# Patient Record
Sex: Female | Born: 1980 | Race: White | Hispanic: No | Marital: Married | Smoking: Former smoker
Health system: Southern US, Community
[De-identification: ages and names within clinical notes are randomized; demographics above are authoritative.]

## PROBLEM LIST (undated history)

## (undated) ENCOUNTER — Inpatient Hospital Stay (HOSPITAL_COMMUNITY): Payer: Self-pay

## (undated) DIAGNOSIS — E669 Obesity, unspecified: Secondary | ICD-10-CM

## (undated) DIAGNOSIS — B009 Herpesviral infection, unspecified: Secondary | ICD-10-CM

## (undated) DIAGNOSIS — O139 Gestational [pregnancy-induced] hypertension without significant proteinuria, unspecified trimester: Secondary | ICD-10-CM

## (undated) HISTORY — DX: Herpesviral infection, unspecified: B00.9

## (undated) HISTORY — PX: TONSILLECTOMY: SUR1361

## (undated) HISTORY — DX: Obesity, unspecified: E66.9

## (undated) HISTORY — DX: Gestational (pregnancy-induced) hypertension without significant proteinuria, unspecified trimester: O13.9

---
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
## 2000-06-13 ENCOUNTER — Other Ambulatory Visit: Admission: RE | Admit: 2000-06-13 | Discharge: 2000-06-13 | Payer: Self-pay | Admitting: Gynecology

## 2001-10-18 ENCOUNTER — Emergency Department (HOSPITAL_COMMUNITY): Admission: EM | Admit: 2001-10-18 | Discharge: 2001-10-19 | Payer: Self-pay | Admitting: Emergency Medicine

## 2001-12-16 ENCOUNTER — Other Ambulatory Visit: Admission: RE | Admit: 2001-12-16 | Discharge: 2001-12-16 | Payer: Self-pay | Admitting: Obstetrics and Gynecology

## 2001-12-16 ENCOUNTER — Other Ambulatory Visit: Admission: RE | Admit: 2001-12-16 | Discharge: 2001-12-16 | Payer: Self-pay

## 2002-04-08 ENCOUNTER — Emergency Department (HOSPITAL_COMMUNITY): Admission: EM | Admit: 2002-04-08 | Discharge: 2002-04-08 | Payer: Self-pay | Admitting: Emergency Medicine

## 2002-04-08 ENCOUNTER — Encounter: Payer: Self-pay | Admitting: Emergency Medicine

## 2002-04-14 ENCOUNTER — Emergency Department (HOSPITAL_COMMUNITY): Admission: EM | Admit: 2002-04-14 | Discharge: 2002-04-14 | Payer: Self-pay | Admitting: Emergency Medicine

## 2002-10-30 ENCOUNTER — Emergency Department (HOSPITAL_COMMUNITY): Admission: EM | Admit: 2002-10-30 | Discharge: 2002-10-30 | Payer: Self-pay | Admitting: Emergency Medicine

## 2002-12-01 ENCOUNTER — Emergency Department (HOSPITAL_COMMUNITY): Admission: EM | Admit: 2002-12-01 | Discharge: 2002-12-01 | Payer: Self-pay | Admitting: Emergency Medicine

## 2002-12-01 ENCOUNTER — Encounter: Payer: Self-pay | Admitting: Emergency Medicine

## 2003-03-17 ENCOUNTER — Emergency Department (HOSPITAL_COMMUNITY): Admission: EM | Admit: 2003-03-17 | Discharge: 2003-03-17 | Payer: Self-pay | Admitting: Emergency Medicine

## 2003-03-22 ENCOUNTER — Emergency Department (HOSPITAL_COMMUNITY): Admission: EM | Admit: 2003-03-22 | Discharge: 2003-03-22 | Payer: Self-pay | Admitting: Emergency Medicine

## 2005-01-09 ENCOUNTER — Other Ambulatory Visit: Admission: RE | Admit: 2005-01-09 | Discharge: 2005-01-09 | Payer: Self-pay | Admitting: Gynecology

## 2005-08-01 ENCOUNTER — Emergency Department (HOSPITAL_COMMUNITY): Admission: EM | Admit: 2005-08-01 | Discharge: 2005-08-01 | Payer: Self-pay | Admitting: Emergency Medicine

## 2006-02-05 ENCOUNTER — Other Ambulatory Visit: Admission: RE | Admit: 2006-02-05 | Discharge: 2006-02-05 | Payer: Self-pay | Admitting: Gynecology

## 2006-04-11 IMAGING — CR DG CHEST 2V
2 series · 2 of 2 positions shown · non-contrast
Comparison: None.

CLINICAL DATA: Asthma attack. 
 CHEST - 2 VIEW:

[view not recorded (1 of 2)]
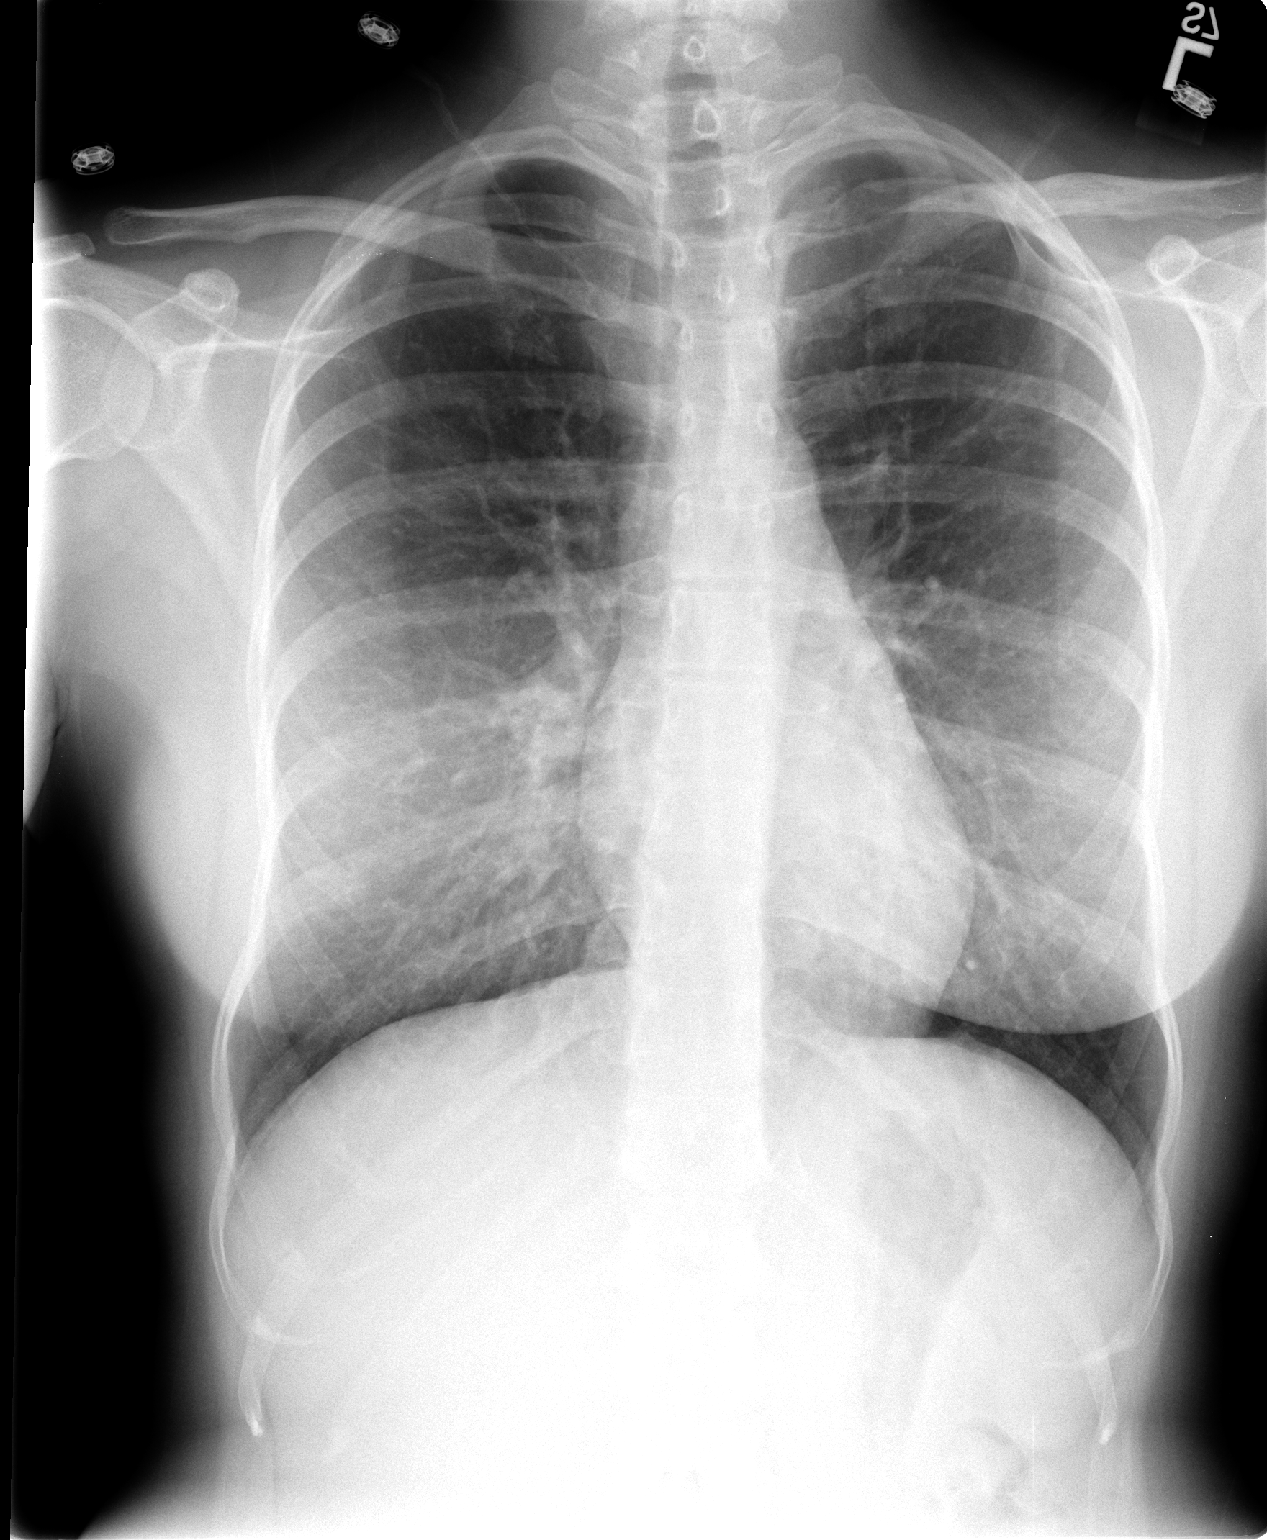

[view not recorded (2 of 2)]
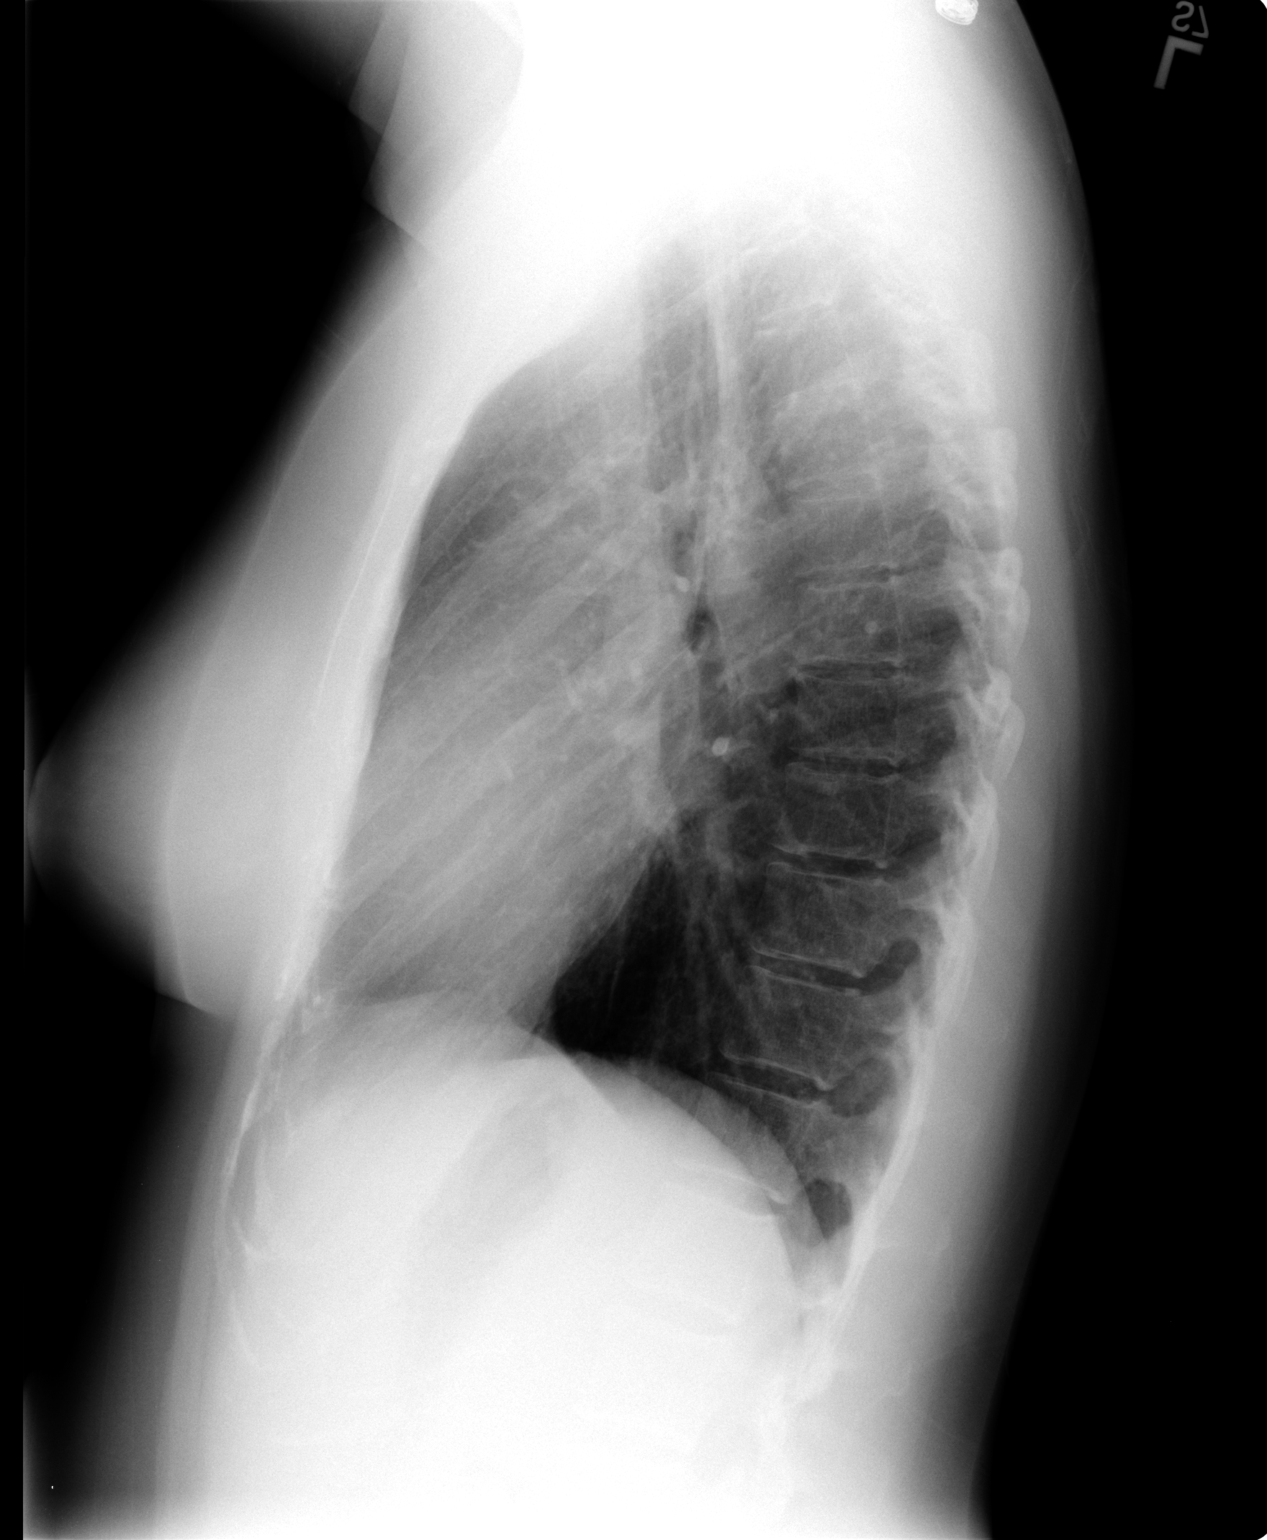

[2 of 2 positions shown; findings below may reference images not displayed]

FINDINGS: The heart size is normal. There are no effusions. 
 The lungs are hyperinflated. There is mild peribronchial thickening.  No infiltrate is noted.
IMPRESSION: Hyperinflated lungs with peribronchial thickening consistent with reactive airways disease.

## 2006-11-30 ENCOUNTER — Emergency Department (HOSPITAL_COMMUNITY): Admission: EM | Admit: 2006-11-30 | Discharge: 2006-11-30 | Payer: Self-pay | Admitting: Emergency Medicine

## 2007-02-04 ENCOUNTER — Inpatient Hospital Stay (HOSPITAL_COMMUNITY): Admission: AD | Admit: 2007-02-04 | Discharge: 2007-02-04 | Payer: Self-pay | Admitting: Obstetrics and Gynecology

## 2007-03-28 ENCOUNTER — Inpatient Hospital Stay (HOSPITAL_COMMUNITY): Admission: AD | Admit: 2007-03-28 | Discharge: 2007-03-28 | Payer: Self-pay | Admitting: Obstetrics and Gynecology

## 2007-06-25 ENCOUNTER — Inpatient Hospital Stay (HOSPITAL_COMMUNITY): Admission: AD | Admit: 2007-06-25 | Discharge: 2007-06-27 | Payer: Self-pay | Admitting: Obstetrics and Gynecology

## 2007-08-10 IMAGING — US US OB COMP LESS 14 WK
1 series · 14 of 28 positions shown · non-contrast
Comparison: None.

CLINICAL DATA: 25-year-old, 10 weeks pregnant with bleeding and spotting.
 OBSTETRICAL ULTRASOUND <14 WKS AND TRANSVAGINAL OB US:
TECHNIQUE: Both transabdominal and transvaginal ultrasound examinations were performed for complete evaluation of the gestation as well as the maternal uterus, adnexal regions, and pelvic cul-de-sac.

[Series 1: unknown · 0.32mm/px · 14 of 69 slices shown]
[im 3/69]
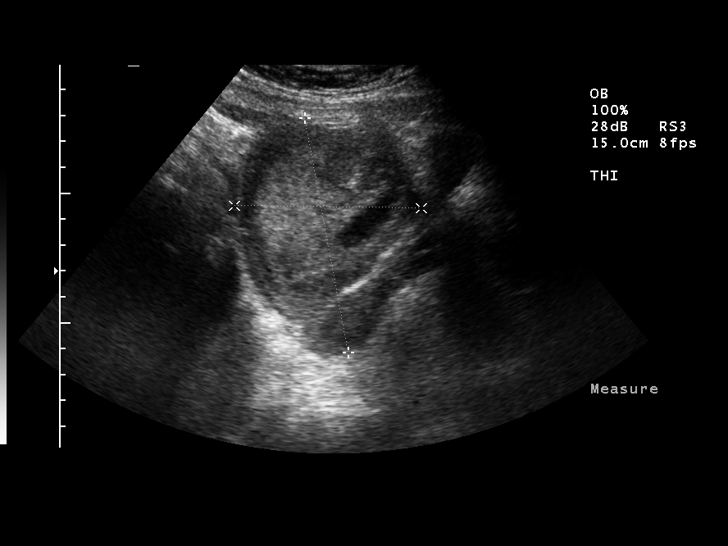
[im 8/69]
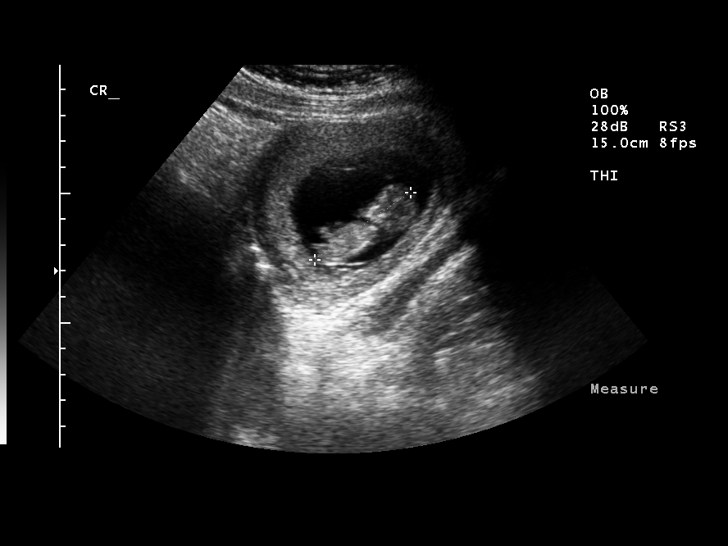
[im 13/69]
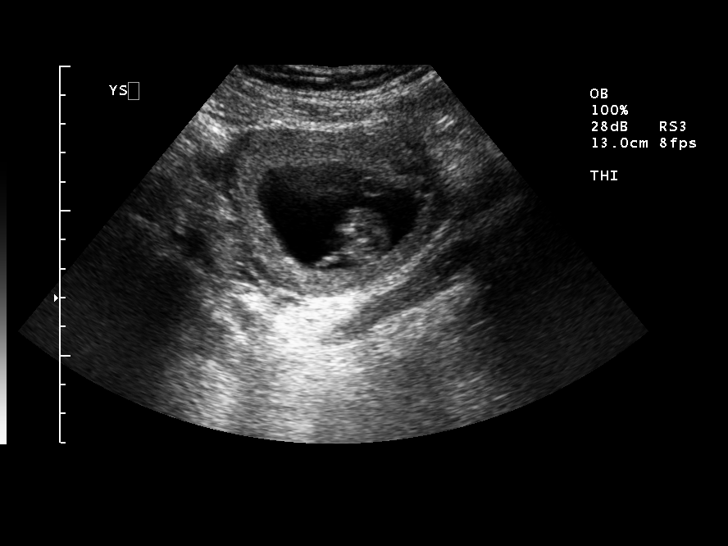
[im 18/69]
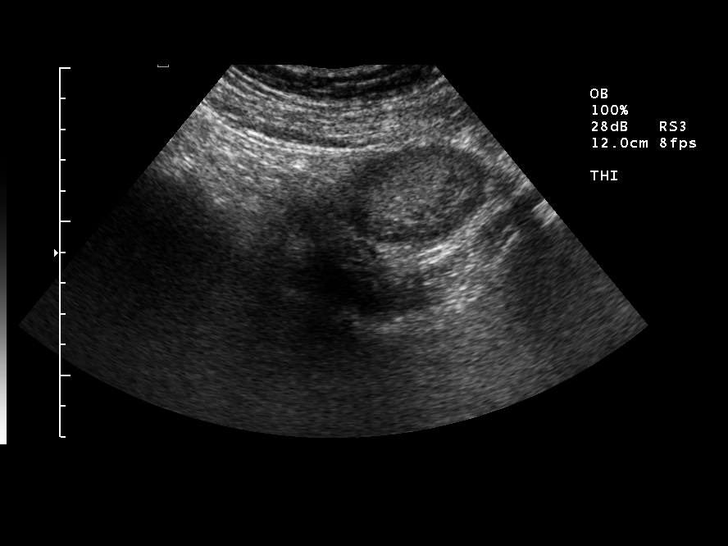
[im 23/69]
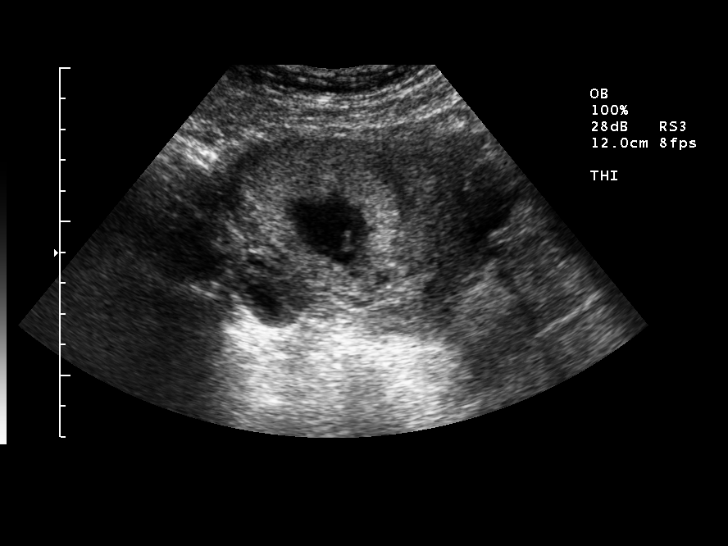
[im 28/69]
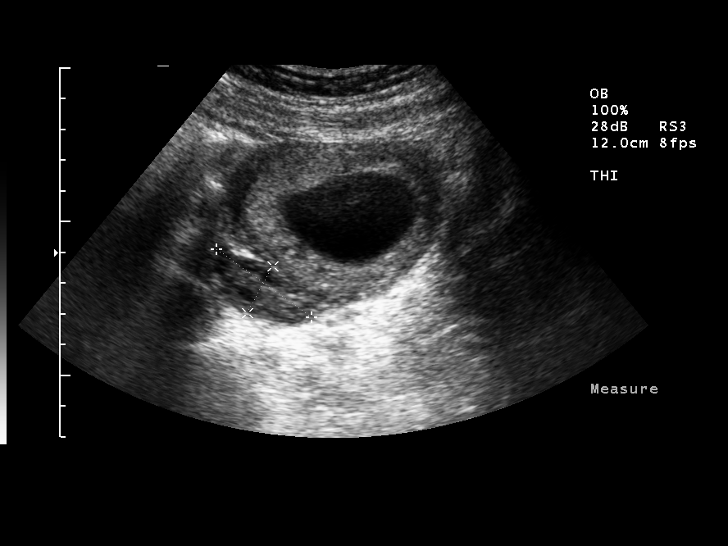
[im 33/69]
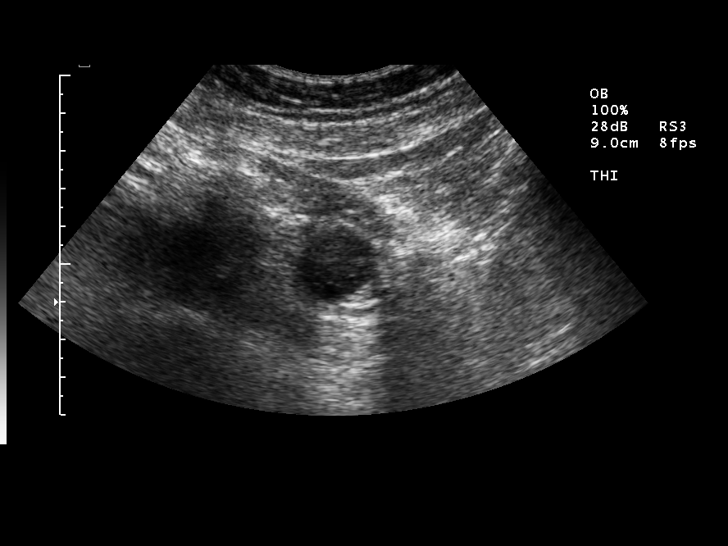
[im 38/69]
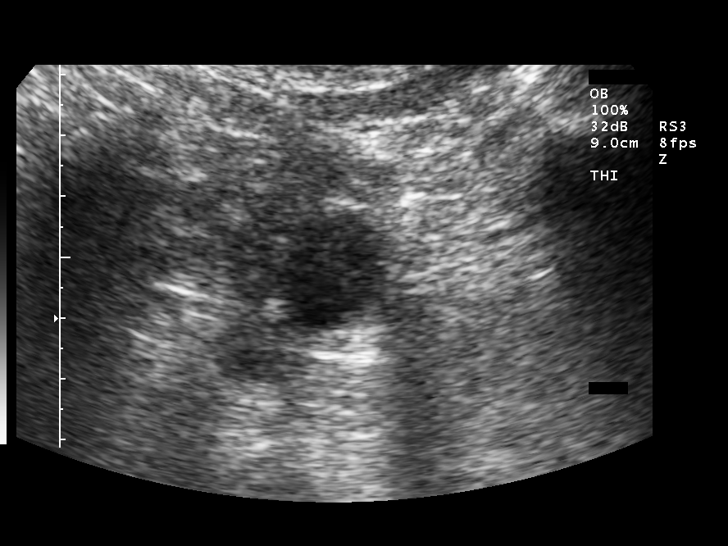
[im 43/69]
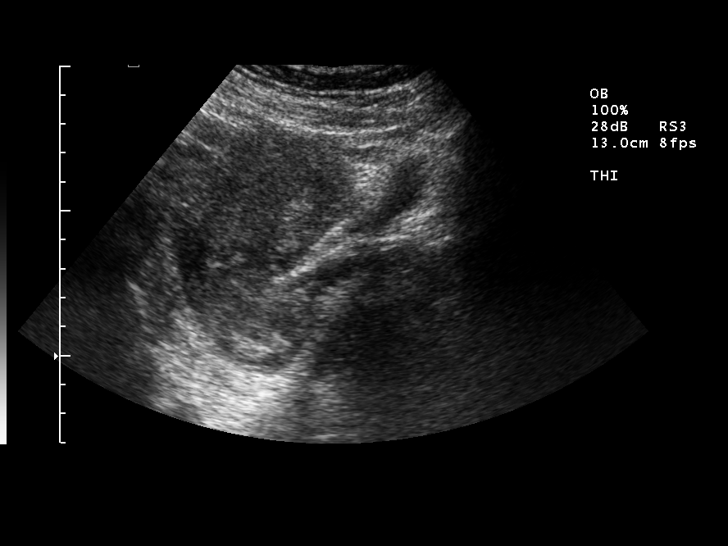
[im 48/69]
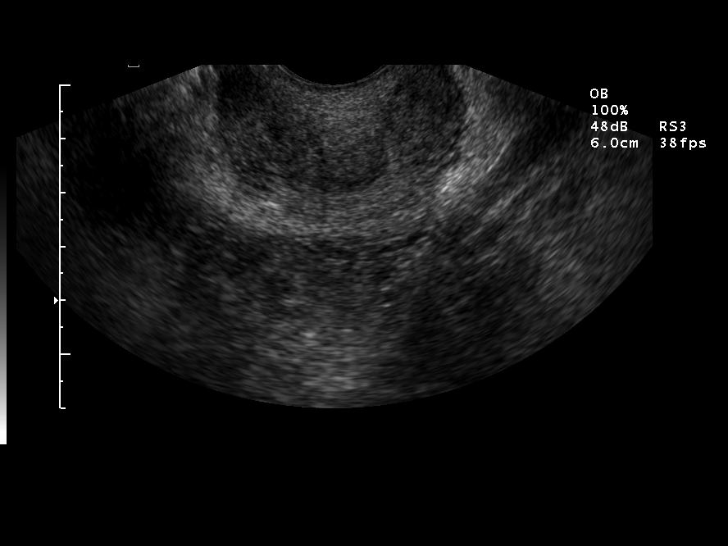
[im 53/69]
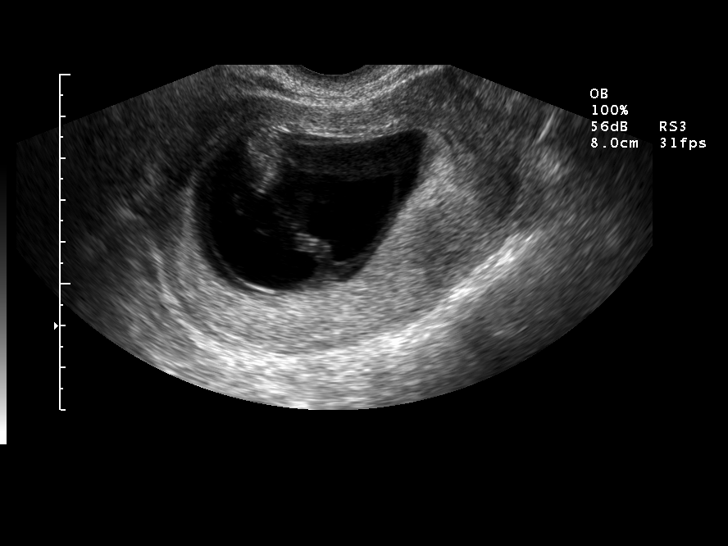
[im 58/69]
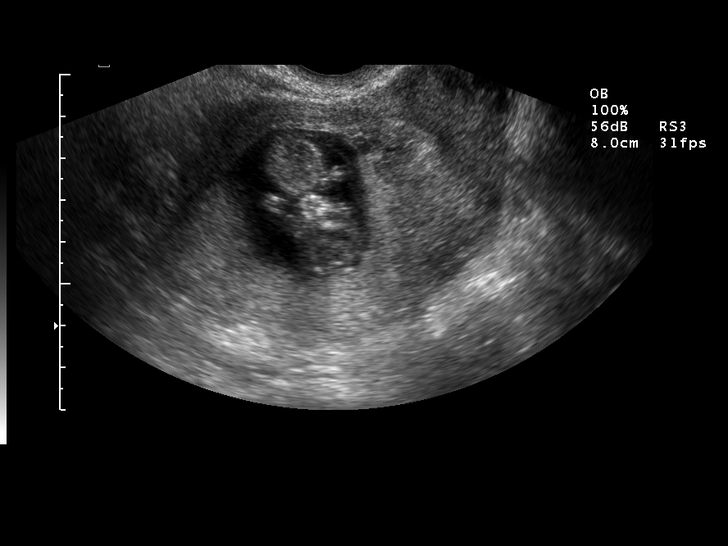
[im 63/69]
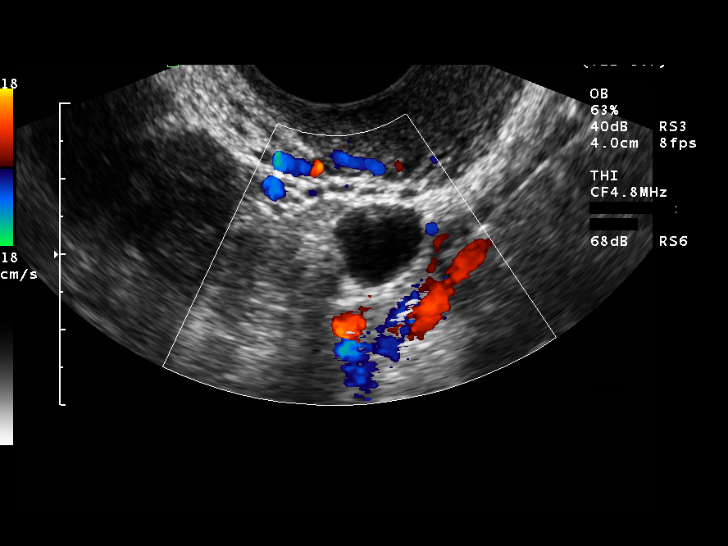
[im 69/69]
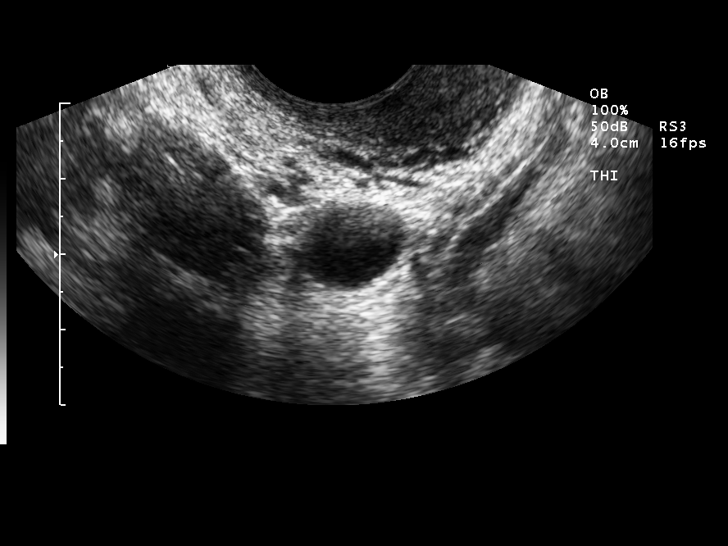

[14 of 28 positions shown; findings below may reference images not displayed]

FINDINGS: There is a single living intrauterine fetus estimated at 11 weeks and 0 days gestation based on crown-rump length of 43.8 mm.  Cardiac activity is noted with a heart rate of 163 beats per minute.  A small yolk sac is seen.
 No subchorionic hemorrhage is seen.  Both ovaries are visualized and appear normal.  Probable small bilateral ovarian cysts.  Trace free pelvic fluid.
IMPRESSION: Single living intrauterine fetus estimated at 11 weeks 0 days gestation by ultrasound.

## 2008-06-24 ENCOUNTER — Inpatient Hospital Stay (HOSPITAL_COMMUNITY): Admission: AD | Admit: 2008-06-24 | Discharge: 2008-06-26 | Payer: Self-pay | Admitting: Obstetrics and Gynecology

## 2008-07-06 ENCOUNTER — Inpatient Hospital Stay (HOSPITAL_COMMUNITY): Admission: AD | Admit: 2008-07-06 | Discharge: 2008-07-06 | Payer: Self-pay | Admitting: Obstetrics and Gynecology

## 2009-04-23 ENCOUNTER — Emergency Department (HOSPITAL_COMMUNITY): Admission: EM | Admit: 2009-04-23 | Discharge: 2009-04-23 | Payer: Self-pay | Admitting: Emergency Medicine

## 2010-01-20 ENCOUNTER — Inpatient Hospital Stay (HOSPITAL_COMMUNITY): Admission: AD | Admit: 2010-01-20 | Discharge: 2010-01-20 | Payer: Self-pay | Admitting: Obstetrics and Gynecology

## 2010-01-21 ENCOUNTER — Inpatient Hospital Stay (HOSPITAL_COMMUNITY): Admission: AD | Admit: 2010-01-21 | Discharge: 2010-01-21 | Payer: Self-pay | Admitting: Obstetrics and Gynecology

## 2010-09-30 ENCOUNTER — Inpatient Hospital Stay (HOSPITAL_COMMUNITY)
Admission: AD | Admit: 2010-09-30 | Discharge: 2010-09-30 | Payer: Self-pay | Source: Home / Self Care | Attending: Obstetrics and Gynecology | Admitting: Obstetrics and Gynecology

## 2010-11-09 ENCOUNTER — Inpatient Hospital Stay (HOSPITAL_COMMUNITY)
Admission: RE | Admit: 2010-11-09 | Discharge: 2010-11-10 | DRG: 774 | Disposition: A | Discharge status: Home / Self Care | Payer: Medicaid Other | Source: Ambulatory Visit | Attending: Obstetrics and Gynecology | Admitting: Obstetrics and Gynecology

## 2010-11-09 DIAGNOSIS — A6000 Herpesviral infection of urogenital system, unspecified: Secondary | ICD-10-CM | POA: Diagnosis present

## 2010-11-09 DIAGNOSIS — O480 Post-term pregnancy: Principal | ICD-10-CM | POA: Diagnosis present

## 2010-11-09 DIAGNOSIS — O98519 Other viral diseases complicating pregnancy, unspecified trimester: Secondary | ICD-10-CM | POA: Diagnosis present

## 2010-11-09 LAB — CBC
HCT: 31 % — ABNORMAL LOW (ref 36.0–46.0)
Hemoglobin: 9.9 g/dL — ABNORMAL LOW (ref 12.0–15.0)
MCH: 26.8 pg (ref 26.0–34.0)
MCHC: 31.9 g/dL (ref 30.0–36.0)
MCV: 84 fL (ref 78.0–100.0)
Platelets: 204 10*3/uL (ref 150–400)
RBC: 3.69 MIL/uL — ABNORMAL LOW (ref 3.87–5.11)
RDW: 14.7 % (ref 11.5–15.5)
WBC: 10 10*3/uL (ref 4.0–10.5)

## 2010-11-09 LAB — RPR: RPR Ser Ql: NONREACTIVE

## 2010-11-10 LAB — CBC
HCT: 28.1 % — ABNORMAL LOW (ref 36.0–46.0)
Hemoglobin: 8.9 g/dL — ABNORMAL LOW (ref 12.0–15.0)
MCH: 27.4 pg (ref 26.0–34.0)
MCHC: 31.7 g/dL (ref 30.0–36.0)
MCV: 86.5 fL (ref 78.0–100.0)
Platelets: 187 10*3/uL (ref 150–400)
RBC: 3.25 MIL/uL — ABNORMAL LOW (ref 3.87–5.11)
RDW: 14.9 % (ref 11.5–15.5)
WBC: 9.1 10*3/uL (ref 4.0–10.5)

## 2010-11-22 NOTE — Discharge Summary (Signed)
  NAMEMARGET, OUTTEN              ACCOUNT NO.:  [id number]  MEDICAL RECORD NO.:  [id number]           PATIENT TYPE:  I  LOCATION:  9113                          FACILITY:  [facility]  PHYSICIAN:  Huel Cote, M.D. DATE OF BIRTH:  [birth year]  DATE OF ADMISSION:  11/09/2010 DATE OF DISCHARGE:  11/10/2010                              DISCHARGE SUMMARY   DISCHARGE DIAGNOSES: 1. Term pregnancy at 40-3/7 weeks delivered. 2. Status post normal spontaneous vaginal delivery.  DISCHARGE MEDICATIONS:  Motrin 6 mg p.o. every 6 hours.  DISCHARGE FOLLOWUP:  The patient is to follow up in the office in 6 weeks for her full postpartum exam.  HOSPITAL COURSE:  The patient is a 30 year old G4, P 2-0-1-2 who came in at 40-3/7 weeks for induction of labor given post due date and favorable cervix.  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  Rh negative and received RhoGAM.  No other issues.  LABORATORY DATA:  Prenatal labs were as follows:  A negative, antibody negative, rubella equivocal, RPR nonreactive, hepatitis B surface antigen negative, HIV negative, GC negative, Chlamydia negative, cystic fibrosis negative, first trimester screen normal.  Alpha-fetoprotein normal, 1-hour Glucola 114, group B strep negative.  PAST OBSTETRICAL HISTORY:  She has had 2 prior vaginal deliveries, one in 2008 of a 7 pounds 14 ounces infant.  The other in 2009 of a 6 pounds 12 ounces infant and one spontaneous miscarriage.  PAST GYN HISTORY:  History of HSV.  PAST SURGICAL HISTORY:  Tonsillectomy in 1996.  PAST MEDICAL HISTORY:  Asthma, stable with her inhaler. 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  ALLERGIES:  PENICILLIN.  MEDICATIONS:  Medications were Valtrex and prenatal vitamins.  PHYSICAL EXAMINATION:  VITAL SIGNS:  On admission, she was afebrile with stable vital  signs.  Fetal heart rate is reactive.  Cervix was 50, 3-4, and -2 station.  She had rupture of membranes performed after low-dose Pitocin was started.  She progressed quickly throughout the day reached complete dilation and had a normal spontaneous vaginal delivery of a vigorous female infant over an intact perineum.  Apgars were 8 and 9, weight was 7 pounds 2 ounces.  Placenta delivery was spontaneous. Estimated blood loss was 400 mL.  She had no lacerations.  On postpartum day #2, she was doing quite well.  Hemoglobin was 8.9 down from 9.9, and she requested early discharge and this is clear with pediatrician, she will be leaving this evening and requested circumcision for infant which will be done this morning.     Huel Cote, M.D.     KR/MEDQ  D:  11/10/2010  T:  11/11/2010  Job:  [id number]  Electronically Signed by Huel Cote M.D. on 11/16/2010 09:10:37 AM

## 2010-12-27 LAB — HCG, QUANTITATIVE, PREGNANCY: hCG, Beta Chain, Quant, S: 756 m[IU]/mL — ABNORMAL HIGH (ref <5)

## 2010-12-28 LAB — URINALYSIS, ROUTINE W REFLEX MICROSCOPIC
Bilirubin Urine: NEGATIVE
Glucose, UA: NEGATIVE mg/dL
Ketones, ur: NEGATIVE mg/dL
Leukocytes, UA: NEGATIVE
Nitrite: NEGATIVE
Protein, ur: NEGATIVE mg/dL
Specific Gravity, Urine: 1.01 (ref 1.005–1.030)
Urobilinogen, UA: 0.2 mg/dL (ref 0.0–1.0)
pH: 7.5 (ref 5.0–8.0)

## 2010-12-28 LAB — GC/CHLAMYDIA PROBE AMP, GENITAL
Chlamydia, DNA Probe: NEGATIVE
GC Probe Amp, Genital: NEGATIVE

## 2010-12-28 LAB — RH IMMUNE GLOBULIN WORKUP (NOT WOMEN'S HOSP)
ABO/RH(D): A NEG
Antibody Screen: NEGATIVE

## 2010-12-28 LAB — CBC
HCT: 38 % (ref 36.0–46.0)
Hemoglobin: 13 g/dL (ref 12.0–15.0)
MCHC: 34.1 g/dL (ref 30.0–36.0)
MCV: 93.4 fL (ref 78.0–100.0)
Platelets: 208 10*3/uL (ref 150–400)
RBC: 4.07 MIL/uL (ref 3.87–5.11)
RDW: 13.3 % (ref 11.5–15.5)
WBC: 5.9 10*3/uL (ref 4.0–10.5)

## 2010-12-28 LAB — WET PREP, GENITAL
Trich, Wet Prep: NONE SEEN
Yeast Wet Prep HPF POC: NONE SEEN

## 2010-12-28 LAB — ABO/RH: ABO/RH(D): A NEG

## 2010-12-28 LAB — POCT PREGNANCY, URINE: Preg Test, Ur: POSITIVE

## 2010-12-28 LAB — URINE MICROSCOPIC-ADD ON

## 2010-12-28 LAB — HCG, QUANTITATIVE, PREGNANCY: hCG, Beta Chain, Quant, S: 1347 m[IU]/mL — ABNORMAL HIGH (ref <5)

## 2011-01-15 LAB — URINALYSIS, ROUTINE W REFLEX MICROSCOPIC
Bilirubin Urine: NEGATIVE
Glucose, UA: NEGATIVE mg/dL
Hgb urine dipstick: NEGATIVE
Ketones, ur: NEGATIVE mg/dL
Nitrite: NEGATIVE
Protein, ur: NEGATIVE mg/dL
Specific Gravity, Urine: 1.014 (ref 1.005–1.030)
Urobilinogen, UA: 1 mg/dL (ref 0.0–1.0)
pH: 7 (ref 5.0–8.0)

## 2011-01-15 LAB — GC/CHLAMYDIA PROBE AMP, GENITAL
Chlamydia, DNA Probe: NEGATIVE
GC Probe Amp, Genital: NEGATIVE

## 2011-01-15 LAB — WET PREP, GENITAL
Clue Cells Wet Prep HPF POC: NONE SEEN
Trich, Wet Prep: NONE SEEN
Yeast Wet Prep HPF POC: NONE SEEN

## 2011-01-15 LAB — URINE MICROSCOPIC-ADD ON

## 2011-01-15 LAB — PREGNANCY, URINE: Preg Test, Ur: NEGATIVE

## 2011-02-24 NOTE — Discharge Summary (Signed)
NAMEEVANEE, LUBRANO NO.:  [id number]   MEDICAL RECORD NO.:  [id number]          PATIENT TYPE:  INP   LOCATION:  9133                          FACILITY:  [facility]   PHYSICIAN:  Huel Cote, M.D. DATE OF BIRTH:  11-09-1980   DATE OF ADMISSION:  06/25/2007  DATE OF DISCHARGE:  06/27/2007                               DISCHARGE SUMMARY   DISCHARGE DIAGNOSES:  1. Term pregnancy at 40-3/7 weeks delivered.  2. Status post normal spontaneous vaginal delivery.   DISCHARGE MEDICATIONS:  1. Motrin 600 mg p.o. every 6 hours p.r.n.  2. Zoloft 60 mg p.o. q.h.s.  3. Valtrex 500 mg p.o. daily.   DISCHARGE FOLLOWUP:  Patient is to follow up in the office in 6 weeks  for her routine postpartum exam.   HOSPITAL COURSE:  Patient is a 30 year old G1, P0 who was admitted at 40-  3/7 weeks' gestation with regular contractions every 3 minutes and  cervical change to 3 to 4 cm.  Prenatal care had been complicated by  history of HSV for which she was on suppression beginning at 36 weeks.  She also had a history of depression which was currently stable.  She  does have asthma, however, has been stable with her inhalers and p.r.n.  medications.   PRENATAL LABS:  A-.  Antibody negative.  RPR nonreactive.  Rubella  immune.  Hepatitis C surface antigen negative.  HIV nonreactive.  GC  negative.  Chlamydia negative.  One hour Glucola 118.  Group B strep  negative.   PAST IV HISTORY:  None.   PAST GYN HISTORY:  History of HSV.   PAST SURGICAL HISTORY:  Tonsillectomy.   PAST MEDICAL HISTORY:  1. She had a history of heroin addiction and had been clean for over 4      years.  2. History of asthma which was stable.  3. History of depression without current medications in the pregnancy.   ALLERGIES:  INCLUDE PENICILLIN.   MEDICATIONS:  1. Valtrex.  2. Prenatal vitamins.   On admission, she was afebrile.  Stable vital signs.  Fetal heart rate  was reassuring.  Cervix was 90 and  3 to 4 at a -2 station.  She had  ruptured membranes with moderate meconium noted and then shortly  thereafter received an epidural.  She continued to progress and reached  complete dilation.  Pushed for approximately 2 hours with a normal  spontaneous vaginal delivery of a viable female infant over a first  degree laceration.  Apgar's were 89.  Weight was 7 pounds 14 ounces.  Placenta delivered spontaneously.  First-degree laceration was repaired  with 2-0 Vicryl with a local block and estimated blood loss was 350 mL.  Cervix and rectum were intact.  Patient's post delivery hemoglobin was  noted to be 10.3 and she underwent normal postpartum care.  By  postpartum day #2, she was feeling that her depression was going to  worsen and was given a prescription for Zoloft 50 mg 2 take home and  begin as she felt necessary.  Her fundus was firm and she was given  instructions  for pelvic rest and to follow up in 6 weeks.      Huel Cote, M.D.  Electronically Signed     KR/MEDQ  D:  07/24/2007  T:  07/25/2007  Job:  [id number]

## 2011-02-24 NOTE — Discharge Summary (Signed)
[name], [name] NO.:  [id number]   MEDICAL RECORD NO.:  [id number]          PATIENT TYPE:  INP   LOCATION:  9140                          [facility]ACILITY:  [facility]   PHYSICIAN:  Huel Cote, M.D. DATE OF BIRTH:  1981/05/01   DATE OF ADMISSION:  06/24/2008  DATE OF DISCHARGE:  06/26/2008                               DISCHARGE SUMMARY   DISCHARGE DIAGNOSES:  1. Term pregnancy at 40 weeks, delivered.  2. Status post normal spontaneous vaginal delivery.   DISCHARGE MEDICATIONS:  Motrin 600 mg p.o. every 6 hours.   DISCHARGE FOLLOWUP:  The patient was planned to be followed up in the  office in 6 weeks for her routine postpartum exam.   HOSPITAL COURSE:  The patient is a 30 year old G2, P1-0-0-1, who was  admitted at 57 weeks' gestation with a favorable cervix and due date  pregnancy, desiring induction of labor.  Her prenatal care was  uncomplicated except for a history of HSV, and she had been on Valtrex  suppression since 36 weeks.   PRENATAL LABS:  A negative.  Antibody negative.  RPR nonreactive.  Rubella immune.  Hepatitis B surface antigen negative.  HIV negative.  GC negative.  Chlamydia negative.  Group B strep negative. One-hour  Glucola negative.  First-trimester screen normal.   PAST OBSTETRICAL HISTORY:  In 2008, she had a 7-pound 14-ounce infant  vaginally.   PAST GYN HISTORY:  History of HSV.   PAST MEDICAL HISTORY:  Asthma, history of depression, but quite stable  now, and a distant history of heroin addiction, although the patient had  been clean for greater than 5 years.   PAST SURGICAL HISTORY:  Tonsillectomy.   ALLERGIES:  PENICILLIN.   MEDICATIONS:  Valtrex daily.   PHYSICAL EXAMINATION:  On admission, she was afebrile with stable vital  signs.  Fetal heart rate was reactive.  Cervix was 53 and a -2 station.  She did quite well and received her epidural and reached complete  dilation.  She pushed great with a normal spontaneous  vaginal delivery  of a vigorous female infant over small first-degree laceration.  Apgars  were 9 and 9.  Weight was 6 pounds 12 ounces.  Placenta delivered  spontaneously.  Her first-  degree laceration was repaired with 2-0 Vicryl for hemostasis.  By  postpartum day #2, she was doing quite well.  She had a discharge  hemoglobin of 9.2 and understood her instructions.  She is to follow up  in the office in 6 weeks for her routine postpartum exam.      Huel Cote, M.D.  Electronically Signed     KR/MEDQ  D:  08/16/2008  T:  08/16/2008  Job:  [id number]

## 2011-04-23 ENCOUNTER — Emergency Department (HOSPITAL_COMMUNITY)
Admission: EM | Admit: 2011-04-23 | Discharge: 2011-04-23 | Disposition: A | Discharge status: Home / Self Care | Payer: Medicaid Other | Attending: Emergency Medicine | Admitting: Emergency Medicine

## 2011-04-23 DIAGNOSIS — R21 Rash and other nonspecific skin eruption: Secondary | ICD-10-CM | POA: Insufficient documentation

## 2011-07-06 LAB — GC/CHLAMYDIA PROBE AMP, GENITAL
Chlamydia: NEGATIVE
Gonorrhea: NEGATIVE

## 2011-07-06 LAB — RPR: RPR: NONREACTIVE

## 2011-07-06 LAB — RUBELLA ANTIBODY, IGM: Rubella: IMMUNE

## 2011-07-06 LAB — ABO/RH

## 2011-07-10 LAB — RPR: RPR Ser Ql: NONREACTIVE

## 2011-07-10 LAB — WET PREP, GENITAL
Clue Cells Wet Prep HPF POC: NONE SEEN
Trich, Wet Prep: NONE SEEN
Yeast Wet Prep HPF POC: NONE SEEN

## 2011-07-10 LAB — CBC
HCT: 27 — ABNORMAL LOW
HCT: 33.2 — ABNORMAL LOW
Hemoglobin: 11.4 — ABNORMAL LOW
Hemoglobin: 12.3
MCHC: 34
MCHC: 34.3
MCV: 94.9
MCV: 95.8
Platelets: 198
Platelets: 258
RBC: 2.82 — ABNORMAL LOW
RBC: 3.5 — ABNORMAL LOW
RDW: 13.1
RDW: 14.8
WBC: 9.1
WBC: 9.2

## 2011-07-10 LAB — URINALYSIS, ROUTINE W REFLEX MICROSCOPIC
Glucose, UA: NEGATIVE
Ketones, ur: NEGATIVE
Nitrite: NEGATIVE
Specific Gravity, Urine: 1.02
pH: 6

## 2011-07-10 LAB — URINE MICROSCOPIC-ADD ON

## 2011-07-10 LAB — GC/CHLAMYDIA PROBE AMP, GENITAL
Chlamydia, DNA Probe: NEGATIVE
GC Probe Amp, Genital: NEGATIVE

## 2011-07-10 LAB — URINE CULTURE

## 2011-07-20 LAB — CBC
HCT: 29.5 — ABNORMAL LOW
Hemoglobin: 11.8 — ABNORMAL LOW
MCHC: 34.9
MCV: 96
Platelets: 247
RBC: 3.07 — ABNORMAL LOW
RBC: 3.56 — ABNORMAL LOW
WBC: 11.8 — ABNORMAL HIGH
WBC: 22.4 — ABNORMAL HIGH

## 2011-07-20 LAB — RPR: RPR Ser Ql: NONREACTIVE

## 2011-07-26 LAB — RH IMMUNE GLOBULIN WORKUP (NOT WOMEN'S HOSP)
ABO/RH(D): A NEG
Antibody Screen: NEGATIVE

## 2011-10-10 NOTE — L&D Delivery Note (Signed)
Delivery Note At 3:40 PM a healthy female was delivered via Vaginal, Spontaneous Delivery (Presentation:LOA ).  APGAR:8,9 ; weight pending.   Placenta status: delivered spontaneously , .  Cord:  with the following complications: short  Anesthesia:  epidural Episiotomy: no Lacerations: none Suture Repair: n/a Est. Blood Loss (mL): 350cc  Mom to postpartum.  Baby to nursery-stable.  Oliver Pila 02/01/2012, 3:49 PM

## 2012-01-08 LAB — STREP B DNA PROBE: GBS: NEGATIVE

## 2012-01-24 ENCOUNTER — Encounter (HOSPITAL_COMMUNITY): Payer: Self-pay | Admitting: *Deleted

## 2012-01-24 ENCOUNTER — Telehealth (HOSPITAL_COMMUNITY): Payer: Self-pay | Admitting: *Deleted

## 2012-01-24 NOTE — Telephone Encounter (Signed)
Preadmission screen  

## 2012-01-28 ENCOUNTER — Encounter (HOSPITAL_COMMUNITY): Payer: Self-pay | Admitting: *Deleted

## 2012-01-28 ENCOUNTER — Inpatient Hospital Stay (HOSPITAL_COMMUNITY)
Admission: AD | Admit: 2012-01-28 | Discharge: 2012-01-28 | Disposition: A | Discharge status: Home / Self Care | Payer: Medicaid Other | Source: Ambulatory Visit | Attending: Obstetrics and Gynecology | Admitting: Obstetrics and Gynecology

## 2012-01-28 DIAGNOSIS — R51 Headache: Secondary | ICD-10-CM | POA: Insufficient documentation

## 2012-01-28 DIAGNOSIS — O99891 Other specified diseases and conditions complicating pregnancy: Secondary | ICD-10-CM | POA: Insufficient documentation

## 2012-01-28 DIAGNOSIS — R030 Elevated blood-pressure reading, without diagnosis of hypertension: Secondary | ICD-10-CM | POA: Insufficient documentation

## 2012-01-28 LAB — URINALYSIS, ROUTINE W REFLEX MICROSCOPIC
Bilirubin Urine: NEGATIVE
Leukocytes, UA: NEGATIVE
Nitrite: NEGATIVE
Specific Gravity, Urine: <1.005 — ABNORMAL LOW (ref 1.005–1.030)
Urobilinogen, UA: 0.2 mg/dL (ref 0.0–1.0)

## 2012-01-28 LAB — COMPREHENSIVE METABOLIC PANEL
CO2: 23 mEq/L (ref 19–32)
Calcium: 9 mg/dL (ref 8.4–10.5)
Creatinine, Ser: 0.45 mg/dL — ABNORMAL LOW (ref 0.50–1.10)
GFR calc Af Amer: >90 mL/min (ref >90)
GFR calc non Af Amer: >90 mL/min (ref >90)
Glucose, Bld: 83 mg/dL (ref 70–99)
Total Protein: 6.3 g/dL (ref 6.0–8.3)

## 2012-01-28 LAB — URINE MICROSCOPIC-ADD ON

## 2012-01-28 LAB — CBC
HCT: 32.7 % — ABNORMAL LOW (ref 36.0–46.0)
MCHC: 33.3 g/dL (ref 30.0–36.0)
MCV: 90.1 fL (ref 78.0–100.0)
Platelets: 167 10*3/uL (ref 150–400)
RDW: 13.3 % (ref 11.5–15.5)

## 2012-01-28 MED ORDER — OXYCODONE-ACETAMINOPHEN 5-325 MG PO TABS
2.0000 | ORAL_TABLET | Freq: Once | ORAL | Status: AC
  Administered 2012-01-28: 2 via ORAL
  Filled 2012-01-28: qty 2

## 2012-01-28 NOTE — Discharge Instructions (Signed)
Hypertension During Pregnancy Hypertension is also called high blood pressure. Blood pressure moves blood in your body. Sometimes, the force that moves the blood becomes too strong. When you are pregnant, this condition should be watched carefully. It can cause problems for you and your baby. HOME CARE   Make and keep all of your doctor visits.   Take medicine as told by your doctor. Tell your doctor about all medicines you take.   Eat very little salt.   Exercise regularly.   Do not drink alcohol.   Do not smoke.   Do not have drinks with caffeine.   Lie on your left side when resting.  GET HELP RIGHT AWAY IF:  You have bad belly (abdominal) pain.   You have sudden puffiness (swelling) in the hands, ankles, or face.   You gain 4 pounds (1.8 kilograms) or more in 1 week.   You throw up (vomit) repeatedly.   You have bleeding from the vagina.   You do not feel the baby moving as much.   You have a headache.   You have blurred or double vision.   You have muscle twitching or spasms.   You have shortness of breath.   You have blue fingernails and lips.   You have blood in your pee (urine).  MAKE SURE YOU:  Understand these instructions.   Will watch your condition.   Will get help right away if you are not doing well.  Document Released: 10/28/2010 Document Revised: 09/14/2011 Document Reviewed: 05/12/2011 [employer] Patient Information 2012 [employer]. 

## 2012-01-28 NOTE — MAU Note (Addendum)
Pt reports having a bad headache. Has taken tylenol with some relief Pt has been being followed for elevated b/p in office. MD told her to come an get checked. Reports good fetal movment

## 2012-01-29 ENCOUNTER — Telehealth (HOSPITAL_COMMUNITY): Payer: Self-pay | Admitting: *Deleted

## 2012-01-29 NOTE — Telephone Encounter (Signed)
Preadmission screen  

## 2012-01-31 ENCOUNTER — Other Ambulatory Visit: Payer: Self-pay | Admitting: Obstetrics and Gynecology

## 2012-02-01 ENCOUNTER — Inpatient Hospital Stay (HOSPITAL_COMMUNITY): Payer: PRIVATE HEALTH INSURANCE | Admitting: Anesthesiology

## 2012-02-01 ENCOUNTER — Inpatient Hospital Stay (HOSPITAL_COMMUNITY)
Admission: RE | Admit: 2012-02-01 | Discharge: 2012-02-02 | DRG: 775 | Disposition: A | Discharge status: Home / Self Care | Payer: PRIVATE HEALTH INSURANCE | Source: Ambulatory Visit | Attending: Obstetrics and Gynecology | Admitting: Obstetrics and Gynecology

## 2012-02-01 ENCOUNTER — Encounter (HOSPITAL_COMMUNITY): Payer: Self-pay

## 2012-02-01 ENCOUNTER — Encounter (HOSPITAL_COMMUNITY): Payer: Self-pay | Admitting: Anesthesiology

## 2012-02-01 DIAGNOSIS — O139 Gestational [pregnancy-induced] hypertension without significant proteinuria, unspecified trimester: Principal | ICD-10-CM | POA: Diagnosis present

## 2012-02-01 LAB — COMPREHENSIVE METABOLIC PANEL
ALT: 7 U/L (ref 0–35)
Alkaline Phosphatase: 164 U/L — ABNORMAL HIGH (ref 39–117)
CO2: 23 mEq/L (ref 19–32)
GFR calc Af Amer: >90 mL/min (ref >90)
Glucose, Bld: 104 mg/dL — ABNORMAL HIGH (ref 70–99)
Potassium: 4.5 mEq/L (ref 3.5–5.1)
Sodium: 138 mEq/L (ref 135–145)
Total Protein: 6 g/dL (ref 6.0–8.3)

## 2012-02-01 LAB — CBC
Hemoglobin: 10.8 g/dL — ABNORMAL LOW (ref 12.0–15.0)
MCHC: 33.1 g/dL (ref 30.0–36.0)
RBC: 3.6 MIL/uL — ABNORMAL LOW (ref 3.87–5.11)

## 2012-02-01 MED ORDER — ZOLPIDEM TARTRATE 5 MG PO TABS: 5.0000 mg | ORAL_TABLET | Freq: Every evening | ORAL | Status: DC | PRN

## 2012-02-01 MED ORDER — BUTORPHANOL TARTRATE 2 MG/ML IJ SOLN
1.0000 mg | Freq: Once | INTRAMUSCULAR | Status: AC
  Administered 2012-02-01: 1 mg via INTRAVENOUS
  Filled 2012-02-01: qty 1

## 2012-02-01 MED ORDER — OXYCODONE-ACETAMINOPHEN 5-325 MG PO TABS: 1.0000 | ORAL_TABLET | ORAL | Status: DC | PRN

## 2012-02-01 MED ORDER — BUTORPHANOL TARTRATE 2 MG/ML IJ SOLN
INTRAMUSCULAR | Status: AC
  Administered 2012-02-01: 1 mg via INTRAVENOUS
  Filled 2012-02-01: qty 1

## 2012-02-01 MED ORDER — LACTATED RINGERS IV SOLN: 500.0000 mL | Freq: Once | INTRAVENOUS | Status: DC

## 2012-02-01 MED ORDER — SIMETHICONE 80 MG PO CHEW: 80.0000 mg | CHEWABLE_TABLET | ORAL | Status: DC | PRN

## 2012-02-01 MED ORDER — DIBUCAINE 1 % RE OINT: 1.0000 "application " | TOPICAL_OINTMENT | RECTAL | Status: DC | PRN

## 2012-02-01 MED ORDER — EPHEDRINE 5 MG/ML INJ: 10.0000 mg | INTRAVENOUS | Status: DC | PRN

## 2012-02-01 MED ORDER — FENTANYL 2.5 MCG/ML BUPIVACAINE 1/10 % EPIDURAL INFUSION (WH - ANES)
14.0000 mL/h | INTRAMUSCULAR | Status: DC
  Administered 2012-02-01 (×2): 14 mL/h via EPIDURAL
  Filled 2012-02-01 (×2): qty 60

## 2012-02-01 MED ORDER — FLEET ENEMA 7-19 GM/118ML RE ENEM: 1.0000 | ENEMA | RECTAL | Status: DC | PRN

## 2012-02-01 MED ORDER — ACETAMINOPHEN 325 MG PO TABS: 650.0000 mg | ORAL_TABLET | ORAL | Status: DC | PRN

## 2012-02-01 MED ORDER — WITCH HAZEL-GLYCERIN EX PADS: 1.0000 "application " | MEDICATED_PAD | CUTANEOUS | Status: DC | PRN

## 2012-02-01 MED ORDER — EPHEDRINE 5 MG/ML INJ
10.0000 mg | INTRAVENOUS | Status: DC | PRN
  Filled 2012-02-01: qty 4

## 2012-02-01 MED ORDER — LIDOCAINE HCL (PF) 1 % IJ SOLN
INTRAMUSCULAR | Status: DC | PRN
  Administered 2012-02-01 (×2): 4 mL

## 2012-02-01 MED ORDER — TETANUS-DIPHTH-ACELL PERTUSSIS 5-2.5-18.5 LF-MCG/0.5 IM SUSP: 0.5000 mL | Freq: Once | INTRAMUSCULAR | Status: DC

## 2012-02-01 MED ORDER — BENZOCAINE-MENTHOL 20-0.5 % EX AERO: 1.0000 "application " | INHALATION_SPRAY | CUTANEOUS | Status: DC | PRN

## 2012-02-01 MED ORDER — LIDOCAINE HCL (PF) 1 % IJ SOLN: 30.0000 mL | INTRAMUSCULAR | Status: DC | PRN

## 2012-02-01 MED ORDER — PHENYLEPHRINE 40 MCG/ML (10ML) SYRINGE FOR IV PUSH (FOR BLOOD PRESSURE SUPPORT)
80.0000 ug | PREFILLED_SYRINGE | INTRAVENOUS | Status: DC | PRN
  Filled 2012-02-01: qty 5

## 2012-02-01 MED ORDER — TERBUTALINE SULFATE 1 MG/ML IJ SOLN: 0.2500 mg | Freq: Once | INTRAMUSCULAR | Status: DC | PRN

## 2012-02-01 MED ORDER — ONDANSETRON HCL 4 MG PO TABS: 4.0000 mg | ORAL_TABLET | ORAL | Status: DC | PRN

## 2012-02-01 MED ORDER — SENNOSIDES-DOCUSATE SODIUM 8.6-50 MG PO TABS
2.0000 | ORAL_TABLET | Freq: Every day | ORAL | Status: DC
  Administered 2012-02-01: 2 via ORAL

## 2012-02-01 MED ORDER — PRENATAL MULTIVITAMIN CH
1.0000 | ORAL_TABLET | Freq: Every day | ORAL | Status: DC
  Administered 2012-02-02: 1 via ORAL
  Filled 2012-02-01: qty 1

## 2012-02-01 MED ORDER — LANOLIN HYDROUS EX OINT: TOPICAL_OINTMENT | CUTANEOUS | Status: DC | PRN

## 2012-02-01 MED ORDER — OXYTOCIN 20 UNITS IN LACTATED RINGERS INFUSION - SIMPLE
1.0000 m[IU]/min | INTRAVENOUS | Status: DC
  Administered 2012-02-01: 4 m[IU]/min via INTRAVENOUS
  Administered 2012-02-01: 2 m[IU]/min via INTRAVENOUS
  Filled 2012-02-01: qty 1000

## 2012-02-01 MED ORDER — LACTATED RINGERS IV SOLN
INTRAVENOUS | Status: DC
  Administered 2012-02-01: 09:00:00 via INTRAVENOUS

## 2012-02-01 MED ORDER — OXYTOCIN 20 UNITS IN LACTATED RINGERS INFUSION - SIMPLE
125.0000 mL/h | Freq: Once | INTRAVENOUS | Status: AC
  Administered 2012-02-01: 999 mL/h via INTRAVENOUS

## 2012-02-01 MED ORDER — CITRIC ACID-SODIUM CITRATE 334-500 MG/5ML PO SOLN: 30.0000 mL | ORAL | Status: DC | PRN

## 2012-02-01 MED ORDER — IBUPROFEN 600 MG PO TABS
600.0000 mg | ORAL_TABLET | Freq: Four times a day (QID) | ORAL | Status: DC
  Administered 2012-02-01 – 2012-02-02 (×5): 600 mg via ORAL
  Filled 2012-02-01 (×5): qty 1

## 2012-02-01 MED ORDER — ONDANSETRON HCL 4 MG/2ML IJ SOLN: 4.0000 mg | INTRAMUSCULAR | Status: DC | PRN

## 2012-02-01 MED ORDER — OXYTOCIN BOLUS FROM INFUSION
500.0000 mL | Freq: Once | INTRAVENOUS | Status: DC
  Filled 2012-02-01: qty 500

## 2012-02-01 MED ORDER — FENTANYL 2.5 MCG/ML BUPIVACAINE 1/10 % EPIDURAL INFUSION (WH - ANES)
INTRAMUSCULAR | Status: DC | PRN
  Administered 2012-02-01: 14 mL/h via EPIDURAL

## 2012-02-01 MED ORDER — PHENYLEPHRINE 40 MCG/ML (10ML) SYRINGE FOR IV PUSH (FOR BLOOD PRESSURE SUPPORT): 80.0000 ug | PREFILLED_SYRINGE | INTRAVENOUS | Status: DC | PRN

## 2012-02-01 MED ORDER — LACTATED RINGERS IV SOLN
500.0000 mL | INTRAVENOUS | Status: DC | PRN
  Administered 2012-02-01: 500 mL via INTRAVENOUS

## 2012-02-01 MED ORDER — IBUPROFEN 600 MG PO TABS: 600.0000 mg | ORAL_TABLET | Freq: Four times a day (QID) | ORAL | Status: DC | PRN

## 2012-02-01 MED ORDER — ONDANSETRON HCL 4 MG/2ML IJ SOLN: 4.0000 mg | Freq: Four times a day (QID) | INTRAMUSCULAR | Status: DC | PRN

## 2012-02-01 MED ORDER — DIPHENHYDRAMINE HCL 25 MG PO CAPS: 25.0000 mg | ORAL_CAPSULE | Freq: Four times a day (QID) | ORAL | Status: DC | PRN

## 2012-02-01 MED ORDER — DIPHENHYDRAMINE HCL 50 MG/ML IJ SOLN: 12.5000 mg | INTRAMUSCULAR | Status: DC | PRN

## 2012-02-01 NOTE — Discharge Summary (Signed)
Obstetric Discharge Summary Reason for Admission: induction of labor due to gestational hypertension Prenatal Procedures: NST Intrapartum Procedures: spontaneous vaginal delivery Postpartum Procedures: none Complications-Operative and Postpartum: none Hemoglobin  Date Value Range Status  02/01/2012 10.8* 12.0-15.0 (g/dL) Final     HCT  Date Value Range Status  02/01/2012 32.6* 36.0-46.0 (%) Final    Physical Exam:  General: alert Lochia: appropriate Uterine Fundus: firm   Discharge Diagnoses: Term Pregnancy-delivered        Gestational Hypertension Discharge Information: Date: 02/01/2012 Activity: pelvic rest Diet: routine Medications: Ibuprofen Condition: improved Instructions: refer to practice specific booklet Discharge to: home   Newborn Data: Live born female  Birth Weight: 7 lb 1.2 oz (3210 g) APGAR: 9, 10  Home with mother.  Oliver Pila 02/01/2012, 11:35 PM

## 2012-02-01 NOTE — Anesthesia Preprocedure Evaluation (Signed)
Anesthesia Evaluation  Patient identified by MRN, date of birth, ID band Patient awake    Reviewed: Allergy & Precautions, H&P , NPO status , Patient's Chart, lab work & pertinent test results  Airway Mallampati: II TM Distance: >3 FB Neck ROM: full    Dental No notable dental hx.    Pulmonary neg pulmonary ROS,    Pulmonary exam normal       Cardiovascular hypertension,     Neuro/Psych negative neurological ROS  negative psych ROS   GI/Hepatic negative GI ROS, Neg liver ROS,   Endo/Other  negative endocrine ROS  Renal/GU negative Renal ROS  negative genitourinary   Musculoskeletal negative musculoskeletal ROS (+)   Abdominal Normal abdominal exam  (+)   Peds negative pediatric ROS (+)  Hematology negative hematology ROS (+)   Anesthesia Other Findings   Reproductive/Obstetrics (+) Pregnancy                           Anesthesia Physical Anesthesia Plan  ASA: II  Anesthesia Plan: Epidural   Post-op Pain Management:    Induction:   Airway Management Planned:   Additional Equipment:   Intra-op Plan:   Post-operative Plan:   Informed Consent: I have reviewed the patients History and Physical, chart, labs and discussed the procedure including the risks, benefits and alternatives for the proposed anesthesia with the patient or authorized representative who has indicated his/her understanding and acceptance.     Plan Discussed with:   Anesthesia Plan Comments:         Anesthesia Quick Evaluation

## 2012-02-01 NOTE — H&P (Signed)
[name] is a 31 y.o. female [id number] at 63 6/7 weeks (EDD 02/09/12 by LMP c/w 7 week [place]) presenting for induction of labor given gestational hypertension.  Patient's BP began to be elevated at 31 weeks with diastolics 90-100.  She has been followed closely with labs, NST's,, and two 24 hour urine tests which showed less than 300mg  of protein.  She has had some intermittent HA's but none currently.  Prenatal care otherwise significant for h/o HSV--on valtrex suppression.  She is Rh negative and received rhogam at 28 weeks.  She has a remote h/o heroin use, but has been clean for 9 years.    Maternal Medical History:  Prenatal complications: Hypertension.     OB History    Grav Para Term Preterm Abortions TAB SAB Ect Mult Living   5 3 3  0 1 0 1 0 0 3    NSVD 2008 7#14oz NSVD 2009 6#12oz NSVD 2012 7#2oz SAB 2011  Past Medical History  Diagnosis Date  . Gestational hypertension   . Herpes    Past Surgical History  Procedure Date  . Tonsillectomy    Family History: family history includes Alcohol abuse in her father; Depression in her mother; Diabetes in her paternal grandmother; Heart attack in her maternal grandmother; Heart disease in her maternal grandmother; Hypertension in her mother; and Thyroid disease in her maternal grandmother and mother. Social History:  reports that she quit smoking about 2 years ago. She has never used smokeless tobacco. She reports that she does not drink alcohol or use illicit drugs.  Review of Systems  Neurological: Negative for headaches.   Cervix 50/2-3/-2 AROM clear  Blood pressure 135/86, pulse 85, last menstrual period 04/26/2011. Maternal Exam:  Uterine Assessment: Contraction strength is mild.  Contraction frequency is irregular.   Abdomen: Patient reports no abdominal tenderness. Fetal presentation: vertex  Introitus: Normal vulva. Normal vagina.    Physical Exam  Constitutional: She is oriented to person, place, and time. She  appears well-developed and well-nourished.  Cardiovascular: Normal rate and regular rhythm.   Respiratory: Effort normal and breath sounds normal.  GI: Soft. Bowel sounds are normal.  Genitourinary: Vagina normal and uterus normal.  Musculoskeletal: Normal range of motion.  Neurological: She is alert and oriented to person, place, and time.  Psychiatric: She has a normal mood and affect. Her behavior is normal.    Prenatal labs: ABO, Rh: A/Negative/-- (09/27 0000) Antibody:  negative Rubella: Immune (09/27 0000) RPR: Nonreactive (09/27 0000)  HBsAg: Negative (09/27 0000)  HIV: Non-reactive (09/27 0000)  GBS: Negative (04/01 0000)  One hour GTT 119 First trimester screen and AFP WNL   Assessment/Plan: Pt admitted for induction for gestational hypertension.  Will check PIH labs and confirm still WNL.  Check cath urine after epidural placement. FHR reactive  [name] W 02/01/2012, 9:18 AM

## 2012-02-01 NOTE — Anesthesia Procedure Notes (Signed)
Epidural Patient location during procedure: OB Start time: 02/01/2012 12:33 PM  Staffing Anesthesiologist: Lashelle Koy A. Performed by: anesthesiologist   Preanesthetic Checklist Completed: patient identified, site marked, surgical consent, pre-op evaluation, timeout performed, IV checked, risks and benefits discussed and monitors and equipment checked  Epidural Patient position: sitting Prep: site prepped and draped and DuraPrep Patient monitoring: continuous pulse ox and blood pressure Approach: midline Injection technique: LOR air  Needle:  Needle type: Tuohy  Needle gauge: 17 G Needle length: 9 cm Needle insertion depth: 5 cm cm Catheter type: closed end flexible Catheter size: 19 Gauge Catheter at skin depth: 10 cm Test dose: negative and Other  Assessment Events: blood not aspirated, injection not painful, no injection resistance, negative IV test and no paresthesia  Additional Notes Patient identified. Risks and benefits discussed including failed block, incomplete  Pain control, post dural puncture headache, nerve damage, paralysis, blood pressure Changes, nausea, vomiting, reactions to medications-both toxic and allergic and post Partum back pain. All questions were answered. Patient expressed understanding and wished to proceed. Sterile technique was used throughout procedure. Epidural site was Dressed with sterile barrier dressing. No paresthesias, signs of intravascular injection Or signs of intrathecal spread were encountered.  Patient was more comfortable after the epidural was dosed. Please see RN's note for documentation of vital signs and FHR which are stable.

## 2012-02-01 NOTE — Progress Notes (Signed)
INF and Pitocin not infusing x 5 mins while iv being changed

## 2012-02-01 NOTE — Progress Notes (Signed)
Patient ID: [name], female   DOB: Jul 03, 1981, 31 y.o.   MRN: [id number] Pt now comfortable with epidural Cervix  90/7/0 with a contraction EFM reassuring

## 2012-02-01 NOTE — Progress Notes (Signed)
Phoned to ask Dr Arby Barrette to find out how long before he is available:  He anticipates [phone].  ot informed

## 2012-02-01 NOTE — Progress Notes (Signed)
   Subjective: Pt extremely uncomfortable, has been waiting on epidural for one hour due to anesthesia tied up with emergency.    Objective: BP 131/97  Pulse 90  Temp(Src) 98.3 F (36.8 C) (Oral)  LMP 04/26/2011      FHT:  FHR: 140 bpm, variability: moderate,  accelerations:  Present,  decelerations:  Absent UC:   regular, every 2-3 minutes SVE:   80/6-7/-1 Labs: Lab Results  Component Value Date   WBC 8.5 02/01/2012   HGB 10.8* 02/01/2012   HCT 32.6* 02/01/2012   MCV 90.6 02/01/2012   PLT 161 02/01/2012    Assessment / Plan: Induction of labor, now active--hoping for epidural soon PIH labs WNL Mialee Weyman W 02/01/2012, 12:22 PM

## 2012-02-02 LAB — CBC
HCT: 29.9 % — ABNORMAL LOW (ref 36.0–46.0)
Hemoglobin: 9.7 g/dL — ABNORMAL LOW (ref 12.0–15.0)
MCH: 29.5 pg (ref 26.0–34.0)
MCHC: 32.4 g/dL (ref 30.0–36.0)
RBC: 3.29 MIL/uL — ABNORMAL LOW (ref 3.87–5.11)

## 2012-02-02 MED ORDER — IBUPROFEN 600 MG PO TABS: 600.0000 mg | ORAL_TABLET | Freq: Four times a day (QID) | ORAL | Status: AC

## 2012-02-02 MED ORDER — RHO D IMMUNE GLOBULIN 1500 UNIT/2ML IJ SOLN
300.0000 ug | Freq: Once | INTRAMUSCULAR | Status: AC
  Administered 2012-02-02: 300 ug via INTRAMUSCULAR
  Filled 2012-02-02: qty 2

## 2012-02-02 NOTE — Anesthesia Postprocedure Evaluation (Signed)
  Anesthesia Post-op Note  Patient: [name]  Procedure(s) Performed: * No procedures listed *  Patient Location: Mother/Baby  Anesthesia Type: Epidural  Level of Consciousness: awake, alert  and oriented  Airway and Oxygen Therapy: Patient Spontanous Breathing  Post-op Pain: mild  Post-op Assessment: Patient's Cardiovascular Status Stable, Respiratory Function Stable, Patent Airway, No signs of Nausea or vomiting and Pain level controlled  Post-op Vital Signs: stable  Complications: No apparent anesthesia complications

## 2012-02-02 NOTE — Progress Notes (Signed)
Post Partum Day 1 Subjective: no complaints, up ad lib and tolerating PO, requests early d/c  Objective: Blood pressure 125/89, pulse 102, temperature 97.5 F (36.4 C), temperature source Oral, resp. rate 20, last menstrual period 04/26/2011, unknown if currently breastfeeding.  Physical Exam:  General: alert Lochia: appropriate Uterine Fundus: firm    Basename 02/02/12 0535 02/01/12 0810  HGB 9.7* 10.8*  HCT 29.9* 32.6*    Assessment/Plan: Discharge home Motrin F/u 6 weeks  Plans Mirena PP   LOS: 1 day   Prachi Oftedahl W 02/02/2012, 8:32 AM

## 2012-02-02 NOTE — Progress Notes (Signed)
UR chart review completed.  

## 2012-02-03 LAB — RH IG WORKUP (INCLUDES ABO/RH)
ABO/RH(D): A NEG
Fetal Screen: NEGATIVE

## 2012-02-06 ENCOUNTER — Inpatient Hospital Stay (HOSPITAL_COMMUNITY): Admission: RE | Admit: 2012-02-06 | Payer: Medicaid Other | Source: Ambulatory Visit

## 2013-07-07 ENCOUNTER — Other Ambulatory Visit (INDEPENDENT_AMBULATORY_CARE_PROVIDER_SITE_OTHER): Payer: BC Managed Care – PPO

## 2013-07-07 ENCOUNTER — Encounter: Payer: Self-pay | Admitting: Internal Medicine

## 2013-07-07 ENCOUNTER — Ambulatory Visit (INDEPENDENT_AMBULATORY_CARE_PROVIDER_SITE_OTHER): Payer: BC Managed Care – PPO | Admitting: Internal Medicine

## 2013-07-07 VITALS — BP 110/80 | HR 70 | Temp 97.5°F | Wt 177.5 lb

## 2013-07-07 DIAGNOSIS — R5381 Other malaise: Secondary | ICD-10-CM

## 2013-07-07 DIAGNOSIS — Z23 Encounter for immunization: Secondary | ICD-10-CM

## 2013-07-07 DIAGNOSIS — Z13220 Encounter for screening for lipoid disorders: Secondary | ICD-10-CM

## 2013-07-07 DIAGNOSIS — L659 Nonscarring hair loss, unspecified: Secondary | ICD-10-CM

## 2013-07-07 DIAGNOSIS — R232 Flushing: Secondary | ICD-10-CM

## 2013-07-07 DIAGNOSIS — Z0000 Encounter for general adult medical examination without abnormal findings: Secondary | ICD-10-CM

## 2013-07-07 DIAGNOSIS — Z130 Encounter for screening for diseases of the blood and blood-forming organs and certain disorders involving the immune mechanism: Secondary | ICD-10-CM

## 2013-07-07 DIAGNOSIS — Z131 Encounter for screening for diabetes mellitus: Secondary | ICD-10-CM

## 2013-07-07 DIAGNOSIS — N951 Menopausal and female climacteric states: Secondary | ICD-10-CM

## 2013-07-07 LAB — CBC
Hemoglobin: 12.4 g/dL (ref 12.0–15.0)
MCHC: 34.4 g/dL (ref 30.0–36.0)
MCV: 88 fl (ref 78.0–100.0)
Platelets: 220 10*3/uL (ref 150.0–400.0)

## 2013-07-07 LAB — COMPREHENSIVE METABOLIC PANEL
AST: 16 U/L (ref 0–37)
BUN: 10 mg/dL (ref 6–23)
Calcium: 9.2 mg/dL (ref 8.4–10.5)
Chloride: 106 mEq/L (ref 96–112)
Creatinine, Ser: 0.6 mg/dL (ref 0.4–1.2)
Glucose, Bld: 103 mg/dL — ABNORMAL HIGH (ref 70–99)

## 2013-07-07 LAB — T4, FREE: Free T4: 0.81 ng/dL (ref 0.60–1.60)

## 2013-07-07 LAB — LIPID PANEL
Cholesterol: 154 mg/dL (ref 0–200)
HDL: 32.4 mg/dL — ABNORMAL LOW (ref >39.00)
Total CHOL/HDL Ratio: 5
Triglycerides: 132 mg/dL (ref 0.0–149.0)

## 2013-07-07 NOTE — Patient Instructions (Signed)

## 2013-07-07 NOTE — Progress Notes (Signed)
HPI Pt presents to the clinic today to establish care. She has not been to a PCP. She does have a few concerns. She is having tension headaches. These occur 2 x per week. She is under a lot of stress. She is a recovering heroin addict. She has 4 kids under the ago of 5. She is going to school full time to complete her bachelors in social work. She is also working in a full time internship. She also c/o hot flashes and hair loss. She does have family history of thyroid dissorder and she would like to get that checked today.  Flu: yearly Tetanus: 2012 LMP: amenorrheic Mirena Pap smear: 11/2010 Eye doctor: as needed Dentist: as needed  Past Medical History  Diagnosis Date  . Gestational hypertension   . Herpes     No current outpatient prescriptions on file.   No current facility-administered medications for this visit.    Allergies  Allergen Reactions  . Penicillins Rash    Family History  Problem Relation Age of Onset  . Depression Mother   . Hypertension Mother   . Thyroid disease Mother   . Alcohol abuse Father   . Heart disease Maternal Grandmother   . Heart attack Maternal Grandmother   . Thyroid disease Maternal Grandmother   . Diabetes Paternal Grandmother     History   Social History  . Marital Status: Married    Spouse Name: N/A    Number of Children: N/A  . Years of Education: N/A   Occupational History  . Not on file.   Social History Main Topics  . Smoking status: Former Smoker    Quit date: 01/23/2010  . Smokeless tobacco: Never Used  . Alcohol Use: No     Comment: h/o drug abuse but not now  . Drug Use: No  . Sexual Activity: Yes   Other Topics Concern  . Not on file   Social History Narrative  . No narrative on file    ROS:  Constitutional: Pt reports headaches and fatigue. Denies fever, malaise, or abrupt weight changes.  HEENT: Denies eye pain, eye redness, ear pain, ringing in the ears, wax buildup, runny nose, nasal congestion,  bloody nose, or sore throat. Respiratory: Denies difficulty breathing, shortness of breath, cough or sputum production.   Cardiovascular: Denies chest pain, chest tightness, palpitations or swelling in the hands or feet.  Gastrointestinal: Denies abdominal pain, bloating, constipation, diarrhea or blood in the stool.  GU: Denies frequency, urgency, pain with urination, blood in urine, odor or discharge. Musculoskeletal: Denies decrease in range of motion, difficulty with gait, muscle pain or joint pain and swelling.  Skin: Pt reports hot flashes and hair loss. Denies redness, rashes, lesions or ulcercations.  Neurological: Denies dizziness, difficulty with memory, difficulty with speech or problems with balance and coordination.   No other specific complaints in a complete review of systems (except as listed in HPI above).  PE:  BP 110/80  Pulse 70  Temp(Src) 97.5 F (36.4 C) (Oral)  Wt 177 lb 8 oz (80.513 kg)  BMI 30.45 kg/m2  SpO2 99% Wt Readings from Last 3 Encounters:  07/07/13 177 lb 8 oz (80.513 kg)  01/28/12 185 lb (83.915 kg)    General: Appears her stated age, obese but well developed, well nourished in NAD. HEENT: Head: normal shape and size; Eyes: sclera white, no icterus, conjunctiva pink, PERRLA and EOMs intact; Ears: Tm's gray and intact, normal light reflex; Nose: mucosa pink and moist, septum midline;  Throat/Mouth: Teeth missing, mucosa pink and moist, no lesions or ulcerations noted.  Neck: Normal range of motion. Neck supple, trachea midline. No massses, lumps or thyromegaly present.  Cardiovascular: Normal rate and rhythm. S1,S2 noted.  No murmur, rubs or gallops noted. No JVD or BLE edema. No carotid bruits noted. Pulmonary/Chest: Normal effort and positive vesicular breath sounds. No respiratory distress. No wheezes, rales or ronchi noted.  Abdomen: Soft and nontender. Normal bowel sounds, no bruits noted. No distention or masses noted. Liver, spleen and kidneys non  palpable. Musculoskeletal: Normal range of motion. No signs of joint swelling. No difficulty with gait.  Neurological: Alert and oriented. Cranial nerves II-XII intact. Coordination normal. +DTRs bilaterally. Psychiatric: Mood and affect normal. Behavior is normal. Judgment and thought content normal.     BMET    Component Value Date/Time   NA 138 02/01/2012 0810   K 4.5 02/01/2012 0810   CL 107 02/01/2012 0810   CO2 23 02/01/2012 0810   GLUCOSE 104* 02/01/2012 0810   BUN 7 02/01/2012 0810   CREATININE 0.52 02/01/2012 0810   CALCIUM 9.0 02/01/2012 0810   GFRNONAA >90 02/01/2012 0810   GFRAA >90 02/01/2012 0810    Lipid Panel  No results found for this basename: chol, trig, hdl, cholhdl, vldl, ldlcalc    CBC    Component Value Date/Time   WBC 8.8 02/02/2012 0535   RBC 3.29* 02/02/2012 0535   HGB 9.7* 02/02/2012 0535   HCT 29.9* 02/02/2012 0535   PLT 145* 02/02/2012 0535   MCV 90.9 02/02/2012 0535   MCH 29.5 02/02/2012 0535   MCHC 32.4 02/02/2012 0535   RDW 13.5 02/02/2012 0535    Hgb A1C No results found for this basename: HGBA1C     Assessment and Plan:  Prevent Health:  Flu shot given today Will get routine screening labs today Will obtain thyroid panel for hair loss, fatigue and hot flashes  RTC in 1 year or sooner if needed

## 2013-10-11 ENCOUNTER — Emergency Department (HOSPITAL_COMMUNITY)
Admission: EM | Admit: 2013-10-11 | Discharge: 2013-10-11 | Disposition: A | Discharge status: Home / Self Care | Payer: BC Managed Care – PPO | Attending: Emergency Medicine | Admitting: Emergency Medicine

## 2013-10-11 ENCOUNTER — Encounter (HOSPITAL_COMMUNITY): Payer: Self-pay | Admitting: Emergency Medicine

## 2013-10-11 DIAGNOSIS — K047 Periapical abscess without sinus: Secondary | ICD-10-CM

## 2013-10-11 DIAGNOSIS — Z8619 Personal history of other infectious and parasitic diseases: Secondary | ICD-10-CM | POA: Insufficient documentation

## 2013-10-11 DIAGNOSIS — Z87891 Personal history of nicotine dependence: Secondary | ICD-10-CM | POA: Insufficient documentation

## 2013-10-11 DIAGNOSIS — Z880 Allergy status to penicillin: Secondary | ICD-10-CM | POA: Insufficient documentation

## 2013-10-11 MED ORDER — CLINDAMYCIN HCL 300 MG PO CAPS
300.0000 mg | ORAL_CAPSULE | Freq: Once | ORAL | Status: AC
  Administered 2013-10-11: 300 mg via ORAL
  Filled 2013-10-11: qty 1

## 2013-10-11 MED ORDER — HYDROCODONE-ACETAMINOPHEN 5-325 MG PO TABS: 1.0000 | ORAL_TABLET | ORAL | Status: DC | PRN

## 2013-10-11 MED ORDER — CLINDAMYCIN HCL 150 MG PO CAPS
300.0000 mg | ORAL_CAPSULE | Freq: Three times a day (TID) | ORAL | Status: DC
Start: 2013-10-11 — End: 2014-04-01

## 2013-10-11 MED ORDER — IBUPROFEN 800 MG PO TABS: 800.0000 mg | ORAL_TABLET | Freq: Three times a day (TID) | ORAL | Status: DC

## 2013-10-11 NOTE — ED Provider Notes (Signed)
CSN: [id number]     Arrival date & time 10/11/13  1414 History  This chart was scribed for non-physician practitioner Elpidio AnisShari Sareena Odeh working with Layla MawKristen N Ward, DO by Elveria Risingimelie Horne, ED Scribe. This patient was seen in room WTR5/WTR5 and the patient's care was started at 3:45 PM.    Chief Complaint  Patient presents with  . Jaw Pain    The history is provided by the patient. No language interpreter was used.   HPI Comments: [name] is a 33 y.o. female who presents to the Emergency Department complaining of gradually worsening left jaw pain and swelling, onset 2 days ago. Patient says she a "few bad teeth" but denies any recent changes or pain. Pain is worsened by eating. Patient reports no pain when swallowing.  Past Medical History  Diagnosis Date  . Gestational hypertension   . Herpes    Past Surgical History  Procedure Laterality Date  . Tonsillectomy     Family History  Problem Relation Age of Onset  . Depression Mother   . Hypertension Mother   . Thyroid disease Mother   . Alcohol abuse Father   . Heart disease Maternal Grandmother   . Heart attack Maternal Grandmother   . Thyroid disease Maternal Grandmother   . Diabetes Paternal Grandmother    History  Substance Use Topics  . Smoking status: Former Smoker    Quit date: 01/23/2010  . Smokeless tobacco: Never Used  . Alcohol Use: No     Comment: h/o drug abuse but not now   OB History   Grav Para Term Preterm Abortions TAB SAB Ect Mult Living   5 4 4  0 1 0 1 0 0 4     Review of Systems  Constitutional: Negative for fever.  HENT: Positive for dental problem.   All other systems reviewed and are negative.    Allergies  Penicillins  Home Medications  No current outpatient prescriptions on file. Triage Vitals: BP 160/99  Pulse 120  Temp(Src) 99.5 F (37.5 C) (Oral)  Resp 16  SpO2 100% Physical Exam  Nursing note and vitals reviewed. Constitutional: She is oriented to person, place, and time.  She appears well-developed and well-nourished. No distress.  HENT:  Head: Normocephalic and atraumatic.  Left Ear: Tympanic membrane normal.  Mouth/Throat: Dental abscesses present.  Generally good dentition without severe decay.   Small abscess visible posterior to left lower rear molar without drainage.  Eyes: EOM are normal.  Neck: Neck supple. No tracheal deviation present.  Pulmonary/Chest: Effort normal. No respiratory distress.  Musculoskeletal: Normal range of motion.  Neurological: She is alert and oriented to person, place, and time.  Skin: Skin is warm and dry.  Psychiatric: She has a normal mood and affect. Her behavior is normal.    ED Course  Procedures (including critical care time) DIAGNOSTIC STUDIES: Oxygen Saturation is 100% on room air, normal by my interpretation.    COORDINATION OF CARE: 3:48 PM- Will prescribe pain medication and antibiotics. Pt advised of plan for treatment and pt agrees.    Labs Review Labs Reviewed - No data to display Imaging Review No results found.  EKG Interpretation   None       MDM  No diagnosis found. 1. Dental abscess  Uncomplicated dental abscess - start on abx. Recommend dental care follow up.  I personally performed the services described in this documentation, which was scribed in my presence. The recorded information has been reviewed and is accurate.  Arnoldo Hooker, PA-C 10/11/13 1601

## 2013-10-11 NOTE — ED Notes (Signed)
She has left tmj area pain/swelling since this Thurs.  She states this pain becomes excruciating whenever she eats.

## 2013-10-11 NOTE — ED Provider Notes (Signed)
Medical screening examination/treatment/procedure(s) were performed by non-physician practitioner and as supervising physician I was immediately available for consultation/collaboration.   Dezire Turk L Axle Parfait, MD 10/11/13 2112 

## 2013-10-11 NOTE — Discharge Instructions (Signed)
Abscessed Tooth °An abscessed tooth is an infection around your tooth. It may be caused by holes or damage to the tooth (cavity) or a dental disease. An abscessed tooth causes mild to very bad pain in and around the tooth. See your dentist right away if you have tooth or gum pain. °HOME CARE °· Take your medicine as told. Finish it even if you start to feel better. °· Do not drive after taking pain medicine. °· Rinse your mouth (gargle) often with salt water (¼ teaspoon salt in 8 ounces of warm water). °· Do not apply heat to the outside of your face. °GET HELP RIGHT AWAY IF:  °· You have a temperature by mouth above 102° F (38.9° C), not controlled by medicine. °· You have chills and a very bad headache. °· You have problems breathing or swallowing. °· Your mouth will not open. °· You develop puffiness (swelling) on the neck or around the eye. °· Your pain is not helped by medicine. °· Your pain is getting worse instead of better. °MAKE SURE YOU:  °· Understand these instructions. °· Will watch your condition. °· Will get help right away if you are not doing well or get worse. °Document Released: 03/13/2008 Document Revised: 12/18/2011 Document Reviewed: 01/03/2011 °[employer]® Patient Information ©2014 [employer]. ° °Dental Care and Dentist Visits °Dental care supports good overall health. Regular dental visits can also help you avoid dental pain, bleeding, infection, and other more serious health problems in the future. It is important to keep the mouth healthy because diseases in the teeth, gums, and other oral tissues can spread to other areas of the body. Some problems, such as diabetes, heart disease, and pre-term labor have been associated with poor oral health.  °See your dentist every 6 months. If you experience emergency problems such as a toothache or broken tooth, go to the dentist right away. If you see your dentist regularly, you may catch problems early. It is easier to be treated for problems in  the early stages.  °WHAT TO EXPECT AT A DENTIST VISIT  °Your dentist will look for many common oral health problems and recommend proper treatment. At your regular dental visit, you can expect: °· Gentle cleaning of the teeth and gums. This includes scraping and polishing. This helps to remove the sticky substance around the teeth and gums (plaque). Plaque forms in the mouth shortly after eating. Over time, plaque hardens on the teeth as tartar. If tartar is not removed regularly, it can cause problems. Cleaning also helps remove stains. °· Periodic X-rays. These pictures of the teeth and supporting bone will help your dentist assess the health of your teeth. °· Periodic fluoride treatments. Fluoride is a natural mineral shown to help strengthen teeth. Fluoride treatment involves applying a fluoride gel or varnish to the teeth. It is most commonly done in children. °· Examination of the mouth, tongue, jaws, teeth, and gums to look for any oral health problems, such as: °· Cavities (dental caries). This is decay on the tooth caused by plaque, sugar, and acid in the mouth. It is best to catch a cavity when it is small. °· Inflammation of the gums caused by plaque buildup (gingivitis). °· Problems with the mouth or malformed or misaligned teeth. °· Oral cancer or other diseases of the soft tissues or jaws.  °KEEP YOUR TEETH AND GUMS HEALTHY °For healthy teeth and gums, follow these general guidelines as well as your dentist's specific advice: °· Have your teeth professionally cleaned at   the dentist every 6 months. °· Brush twice daily with a fluoride toothpaste. °· Floss your teeth daily.  °· Ask your dentist if you need fluoride supplements, treatments, or fluoride toothpaste. °· Eat a healthy diet. Reduce foods and drinks with added sugar. °· Avoid smoking. °TREATMENT FOR ORAL HEALTH PROBLEMS °If you have oral health problems, treatment varies depending on the conditions present in your teeth and gums. °· Your  caregiver will most likely recommend good oral hygiene at each visit. °· For cavities, gingivitis, or other oral health disease, your caregiver will perform a procedure to treat the problem. This is typically done at a separate appointment. Sometimes your caregiver will refer you to another dental specialist for specific tooth problems or for surgery. °SEEK IMMEDIATE DENTAL CARE IF: °· You have pain, bleeding, or soreness in the gum, tooth, jaw, or mouth area. °· A permanent tooth becomes loose or separated from the gum socket. °· You experience a blow or injury to the mouth or jaw area. °Document Released: 06/07/2011 Document Revised: 12/18/2011 Document Reviewed: 06/07/2011 °ExitCare® Patient Information ©2014 [employer]. ° °

## 2014-04-01 ENCOUNTER — Encounter: Payer: Self-pay | Admitting: Internal Medicine

## 2014-04-01 ENCOUNTER — Ambulatory Visit (INDEPENDENT_AMBULATORY_CARE_PROVIDER_SITE_OTHER): Payer: BC Managed Care – PPO | Admitting: Internal Medicine

## 2014-04-01 VITALS — BP 140/100 | HR 104 | Temp 98.2°F | Ht 64.0 in | Wt 189.4 lb

## 2014-04-01 DIAGNOSIS — B009 Herpesviral infection, unspecified: Secondary | ICD-10-CM | POA: Insufficient documentation

## 2014-04-01 DIAGNOSIS — L299 Pruritus, unspecified: Secondary | ICD-10-CM | POA: Insufficient documentation

## 2014-04-01 DIAGNOSIS — O139 Gestational [pregnancy-induced] hypertension without significant proteinuria, unspecified trimester: Secondary | ICD-10-CM | POA: Insufficient documentation

## 2014-04-01 DIAGNOSIS — R030 Elevated blood-pressure reading, without diagnosis of hypertension: Secondary | ICD-10-CM

## 2014-04-01 MED ORDER — PREDNISONE 10 MG PO TABS: ORAL_TABLET | ORAL | Status: AC

## 2014-04-01 MED ORDER — METHYLPREDNISOLONE ACETATE 80 MG/ML IJ SUSP
80.0000 mg | Freq: Once | INTRAMUSCULAR | Status: AC
  Administered 2014-04-01: 80 mg via INTRAMUSCULAR

## 2014-04-01 NOTE — Patient Instructions (Addendum)
You had the steroid shot today  Please take all new medication as prescribed - the prednisone  You can also take OTC zyrtec at night to help with itching and sleep  You can also use the OTC Benadryl cream for the worst itchy places (or [place], or capsiacin cream OTC)  Please monitor your blood pressure on a regular basis, with the goal being less than 140/90  Please continue all other medications as before, and refills have been done if requested.  Please have the pharmacy call with any other refills you may need.  Please continue your efforts at being more active, low cholesterol diet, and weight control.

## 2014-04-01 NOTE — Progress Notes (Signed)
Pre visit review using our clinic review tool, if applicable. No additional management support is needed unless otherwise documented below in the visit note. 

## 2014-04-01 NOTE — Assessment & Plan Note (Signed)
Likely allergic type without specific rash or angioedema, for depomedrol IM, predpac asd,  to f/u any worsening symptoms or concerns

## 2014-04-01 NOTE — Assessment & Plan Note (Signed)
D/w pt, declines med change today, for close f/u with BP at home (mother is RN with BP cuff at home)

## 2014-04-01 NOTE — Progress Notes (Signed)
   Subjective:    Patient ID: [name], female    DOB: [birth year], 33 y.o.   MRN: [id number]  HPI    Here with diffuse whole body itching for 2-3 days, worst to palms and soles, cant stop scratching and leaves red marks like on right arm today, but no other rash, swelling, tongue or lip swelling, and Pt denies chest pain, increased sob or doe, wheezing, orthopnea, PND, increased LE swelling, palpitations, dizziness or syncope.  No fever or pain but overall feels very uncomfortable.  No recent med changes, OTC meds, or Known liver dz.   Has hx of gestational HTN, has not been checked since.  S/p IUD, not pregnant Past Medical History  Diagnosis Date  . Gestational hypertension   . Herpes   . Obesity    Past Surgical History  Procedure Laterality Date  . Tonsillectomy      reports that she quit smoking about 4 years ago. She has never used smokeless tobacco. She reports that she does not drink alcohol or use illicit drugs. family history includes Alcohol abuse in her father; Depression in her mother; Diabetes in her paternal grandmother; Heart attack in her maternal grandmother; Heart disease in her maternal grandmother; Hypertension in her mother; Thyroid disease in her maternal grandmother and mother. Allergies  Allergen Reactions  . Penicillins Hives and Rash   No current outpatient prescriptions on file prior to visit.   No current facility-administered medications on file prior to visit.    Review of Systems All otherwise neg per pt     Objective:   Physical Exam BP 140/100  Pulse 104  Temp(Src) 98.2 F (36.8 C) (Oral)  Ht 5\' 4"  (1.626 m)  Wt 189 lb 6 oz (85.9 kg)  BMI 32.49 kg/m2  SpO2 97% VS noted,  Constitutional: Pt appears well-developed, well-nourished.  HENT: Head: NCAT.  Right Ear: External ear normal.  Left Ear: External ear normal.  Eyes: . Pupils are equal, round, and reactive to light. Conjunctivae and EOM are normal Neck: Normal range of motion.  Neck supple.  Cardiovascular: Normal rate and regular rhythm.   Pulmonary/Chest: Effort normal and breath sounds normal.  - no rales or wheezing Neurological: Pt is alert. Not confused , motor grossly intact Skin: Skin is warm. No rash or swelling Psychiatric: Pt behavior is normal. No agitation.     Assessment & Plan:

## 2014-07-31 ENCOUNTER — Ambulatory Visit: Payer: BC Managed Care – PPO | Admitting: Internal Medicine

## 2014-08-10 ENCOUNTER — Encounter: Payer: Self-pay | Admitting: Internal Medicine
